# Patient Record
Sex: Female | Born: 1985 | Smoking: Never smoker
Health system: Southern US, Community
[De-identification: ages and names within clinical notes are randomized; demographics above are authoritative.]

## PROBLEM LIST (undated history)

## (undated) ENCOUNTER — Inpatient Hospital Stay (HOSPITAL_COMMUNITY): Payer: Self-pay

## (undated) DIAGNOSIS — I341 Nonrheumatic mitral (valve) prolapse: Secondary | ICD-10-CM

## (undated) DIAGNOSIS — D573 Sickle-cell trait: Secondary | ICD-10-CM

## (undated) DIAGNOSIS — N809 Endometriosis, unspecified: Secondary | ICD-10-CM

## (undated) DIAGNOSIS — Z8742 Personal history of other diseases of the female genital tract: Secondary | ICD-10-CM

## (undated) DIAGNOSIS — N83201 Unspecified ovarian cyst, right side: Secondary | ICD-10-CM

## (undated) DIAGNOSIS — R011 Cardiac murmur, unspecified: Secondary | ICD-10-CM

## (undated) DIAGNOSIS — K219 Gastro-esophageal reflux disease without esophagitis: Secondary | ICD-10-CM

## (undated) DIAGNOSIS — D219 Benign neoplasm of connective and other soft tissue, unspecified: Secondary | ICD-10-CM

## (undated) DIAGNOSIS — N83202 Unspecified ovarian cyst, left side: Secondary | ICD-10-CM

## (undated) DIAGNOSIS — D259 Leiomyoma of uterus, unspecified: Secondary | ICD-10-CM

## (undated) DIAGNOSIS — Z862 Personal history of diseases of the blood and blood-forming organs and certain disorders involving the immune mechanism: Secondary | ICD-10-CM

## (undated) DIAGNOSIS — N979 Female infertility, unspecified: Secondary | ICD-10-CM

## (undated) DIAGNOSIS — D649 Anemia, unspecified: Secondary | ICD-10-CM

## (undated) HISTORY — PX: OVARIAN CYST REMOVAL: SHX89

---

## 2009-07-15 HISTORY — PX: WISDOM TOOTH EXTRACTION: SHX21

## 2013-04-16 ENCOUNTER — Other Ambulatory Visit (HOSPITAL_COMMUNITY)
Admission: RE | Admit: 2013-04-16 | Discharge: 2013-04-16 | Disposition: A | Discharge status: Home / Self Care | Payer: No Typology Code available for payment source | Source: Ambulatory Visit | Attending: Obstetrics and Gynecology | Admitting: Obstetrics and Gynecology

## 2013-04-16 DIAGNOSIS — Z1151 Encounter for screening for human papillomavirus (HPV): Secondary | ICD-10-CM | POA: Insufficient documentation

## 2013-04-16 DIAGNOSIS — Z01419 Encounter for gynecological examination (general) (routine) without abnormal findings: Secondary | ICD-10-CM | POA: Insufficient documentation

## 2013-04-16 DIAGNOSIS — Z113 Encounter for screening for infections with a predominantly sexual mode of transmission: Secondary | ICD-10-CM | POA: Insufficient documentation

## 2013-04-16 DIAGNOSIS — R87810 Cervical high risk human papillomavirus (HPV) DNA test positive: Secondary | ICD-10-CM | POA: Insufficient documentation

## 2013-05-28 ENCOUNTER — Other Ambulatory Visit: Payer: Self-pay | Admitting: Obstetrics and Gynecology

## 2013-05-28 DIAGNOSIS — R1900 Intra-abdominal and pelvic swelling, mass and lump, unspecified site: Secondary | ICD-10-CM

## 2013-06-01 ENCOUNTER — Ambulatory Visit
Admission: RE | Admit: 2013-06-01 | Discharge: 2013-06-01 | Disposition: A | Discharge status: Home / Self Care | Payer: No Typology Code available for payment source | Source: Ambulatory Visit | Attending: Obstetrics and Gynecology | Admitting: Obstetrics and Gynecology

## 2013-06-01 DIAGNOSIS — R1900 Intra-abdominal and pelvic swelling, mass and lump, unspecified site: Secondary | ICD-10-CM

## 2013-06-01 MED ORDER — IOHEXOL 300 MG/ML  SOLN
100.0000 mL | Freq: Once | INTRAMUSCULAR | Status: AC | PRN
  Administered 2013-06-01: 100 mL via INTRAVENOUS

## 2013-07-02 HISTORY — PX: LAPAROSCOPIC ABDOMINAL EXPLORATION: SHX6249

## 2014-04-29 ENCOUNTER — Emergency Department (HOSPITAL_COMMUNITY): Payer: Managed Care, Other (non HMO)

## 2014-04-29 ENCOUNTER — Encounter (HOSPITAL_COMMUNITY): Payer: Self-pay | Admitting: Emergency Medicine

## 2014-04-29 ENCOUNTER — Emergency Department (HOSPITAL_COMMUNITY)
Admission: EM | Admit: 2014-04-29 | Discharge: 2014-04-29 | Disposition: A | Discharge status: Home / Self Care | Payer: Managed Care, Other (non HMO) | Attending: Emergency Medicine | Admitting: Emergency Medicine

## 2014-04-29 DIAGNOSIS — R0789 Other chest pain: Secondary | ICD-10-CM | POA: Diagnosis not present

## 2014-04-29 DIAGNOSIS — J069 Acute upper respiratory infection, unspecified: Secondary | ICD-10-CM | POA: Insufficient documentation

## 2014-04-29 DIAGNOSIS — Z8679 Personal history of other diseases of the circulatory system: Secondary | ICD-10-CM | POA: Diagnosis not present

## 2014-04-29 DIAGNOSIS — R05 Cough: Secondary | ICD-10-CM | POA: Diagnosis present

## 2014-04-29 HISTORY — DX: Nonrheumatic mitral (valve) prolapse: I34.1

## 2014-04-29 MED ORDER — ALBUTEROL SULFATE HFA 108 (90 BASE) MCG/ACT IN AERS: 2.0000 | INHALATION_SPRAY | RESPIRATORY_TRACT | Status: DC | PRN

## 2014-04-29 NOTE — ED Notes (Signed)
Per diagnosed with URI two days ago. Pt reports worsening SOB, left side chest pain, and nonproductive cough since.

## 2014-04-29 NOTE — ED Provider Notes (Signed)
TIME SEEN: 11:10 AM  CHIEF COMPLAINT: Dry cough, chest tightness  HPI: Patient is a 28 year old female with history of mitral valve prolapse who presents to the emergency department with dry cough, chest tightness and shortness of breath is worse with coughing that started 2 days ago. She was diagnosed with upper respiratory infection and started on azithromycin. She states that her symptoms have not been improving. She denies any fevers, chills. No nausea, vomiting or diarrhea. No history of hypertension, diabetes, hyperlipidemia, tobacco use or family history of premature CAD. No history of PE or DVT, recent prolonged immobilization such as long flight or hospitalization, fracture, surgery, trauma, exogenous hormone use.  No calf swelling or tenderness on exam.  ROS: See HPI Constitutional: no fever  Eyes: no drainage  ENT: no runny nose   Cardiovascular:   chest pain  Resp:  SOB  GI: no vomiting GU: no dysuria Integumentary: no rash  Allergy: no hives  Musculoskeletal: no leg swelling  Neurological: no slurred speech ROS otherwise negative  PAST MEDICAL HISTORY/PAST SURGICAL HISTORY:  Past Medical History  Diagnosis Date  . Mitral valve prolapse     MEDICATIONS:  Prior to Admission medications   Not on File    ALLERGIES:  No Known Allergies  SOCIAL HISTORY:  History  Substance Use Topics  . Smoking status: Never Smoker   . Smokeless tobacco: Not on file  . Alcohol Use: No    FAMILY HISTORY: No family history on file.  EXAM: BP 113/74  Pulse 81  Temp(Src) 98.2 F (36.8 C) (Oral)  Resp 14  SpO2 100%  LMP 04/26/2014 CONSTITUTIONAL: Alert and oriented and responds appropriately to questions. Well-appearing; well-nourished, pleasant, smiling, nontoxic HEAD: Normocephalic EYES: Conjunctivae clear, PERRL ENT: normal nose; no rhinorrhea; moist mucous membranes; pharynx without lesions noted NECK: Supple, no meningismus, no LAD  CARD: RRR; S1 and S2 appreciated; no  murmurs, no clicks, no rubs, no gallops RESP: Normal chest excursion without splinting or tachypnea; breath sounds clear and equal bilaterally; no wheezes, no rhonchi, no rales, no hypoxia or respiratory distress, speaking full sentences, no increased work of breathing ABD/GI: Normal bowel sounds; non-distended; soft, non-tender, no rebound, no guarding BACK:  The back appears normal and is non-tender to palpation, there is no CVA tenderness EXT: Normal ROM in all joints; non-tender to palpation; no edema; normal capillary refill; no cyanosis    SKIN: Normal color for age and race; warm NEURO: Moves all extremities equally PSYCH: The patient's mood and manner are appropriate. Grooming and personal hygiene are appropriate.  MEDICAL DECISION MAKING: Patient here with complaints of symptoms of a viral illness. Her lungs are clear to auscultation and she has no diminished breath sounds. No fevers. Discussed with patient the reason she is likely not getting better with azithromycin as of this is probably due to a virus. She is PERC negative and has no risk factors for ACS. Doubt dissection given she is very well-appearing and denies any radiation of her pain. Her EKG shows no ischemic changes. We'll obtain chest x-ray to evaluate for infiltrate, pneumothorax.  ED PROGRESS: Patient's chest x-ray is clear. We'll discharge home. We'll have her alternate Tylenol and Motrin for pain. She states that she was concerned about going back to work. Have discussed with patient I feel is appropriate that she may go back to work today. Discussed return precautions.      EKG Interpretation  Date/Time:  Friday April 29 2014 10:56:15 EDT Ventricular Rate:  89 PR Interval:  139 QRS Duration: 99 QT Interval:  364 QTC Calculation: 443 R Axis:   -35 Text Interpretation:  Sinus rhythm S1,S2,S3 pattern RSR' in V1 or V2, probably normal variant Baseline wander in lead(s) V4 V5 V6 Confirmed by WARD,  DO, KRISTEN  ([id number]) on 04/29/2014 10:58:19 AM        Levy, DO 04/29/14 1212

## 2014-04-29 NOTE — Discharge Instructions (Signed)
Upper Respiratory Infection, Adult An upper respiratory infection (URI) is also sometimes known as the common cold. The upper respiratory tract includes the nose, sinuses, throat, trachea, and bronchi. Bronchi are the airways leading to the lungs. Most people improve within 1 week, but symptoms can last up to 2 weeks. A residual cough may last even longer.  CAUSES Many different viruses can infect the tissues lining the upper respiratory tract. The tissues become irritated and inflamed and often become very moist. Mucus production is also common. A cold is contagious. You can easily spread the virus to others by oral contact. This includes kissing, sharing a glass, coughing, or sneezing. Touching your mouth or nose and then touching a surface, which is then touched by another person, can also spread the virus. SYMPTOMS  Symptoms typically develop 1 to 3 days after you come in contact with a cold virus. Symptoms vary from person to person. They may include:  Runny nose.  Sneezing.  Nasal congestion.  Sinus irritation.  Sore throat.  Loss of voice (laryngitis).  Cough.  Fatigue.  Muscle aches.  Loss of appetite.  Headache.  Low-grade fever. DIAGNOSIS  You might diagnose your own cold based on familiar symptoms, since most people get a cold 2 to 3 times a year. Your caregiver can confirm this based on your exam. Most importantly, your caregiver can check that your symptoms are not due to another disease such as strep throat, sinusitis, pneumonia, asthma, or epiglottitis. Blood tests, throat tests, and X-rays are not necessary to diagnose a common cold, but they may sometimes be helpful in excluding other more serious diseases. Your caregiver will decide if any further tests are required. RISKS AND COMPLICATIONS  You may be at risk for a more severe case of the common cold if you smoke cigarettes, have chronic heart disease (such as heart failure) or lung disease (such as asthma), or if  you have a weakened immune system. The very young and very old are also at risk for more serious infections. Bacterial sinusitis, middle ear infections, and bacterial pneumonia can complicate the common cold. The common cold can worsen asthma and chronic obstructive pulmonary disease (COPD). Sometimes, these complications can require emergency medical care and may be life-threatening. PREVENTION  The best way to protect against getting a cold is to practice good hygiene. Avoid oral or hand contact with people with cold symptoms. Wash your hands often if contact occurs. There is no clear evidence that vitamin C, vitamin E, echinacea, or exercise reduces the chance of developing a cold. However, it is always recommended to get plenty of rest and practice good nutrition. TREATMENT  Treatment is directed at relieving symptoms. There is no cure. Antibiotics are not effective, because the infection is caused by a virus, not by bacteria. Treatment may include:  Increased fluid intake. Sports drinks offer valuable electrolytes, sugars, and fluids.  Breathing heated mist or steam (vaporizer or shower).  Eating chicken soup or other clear broths, and maintaining good nutrition.  Getting plenty of rest.  Using gargles or lozenges for comfort.  Controlling fevers with ibuprofen or acetaminophen as directed by your caregiver.  Increasing usage of your inhaler if you have asthma. Zinc gel and zinc lozenges, taken in the first 24 hours of the common cold, can shorten the duration and lessen the severity of symptoms. Pain medicines may help with fever, muscle aches, and throat pain. A variety of non-prescription medicines are available to treat congestion and runny nose. Your caregiver   can make recommendations and may suggest nasal or lung inhalers for other symptoms.  HOME CARE INSTRUCTIONS   Only take over-the-counter or prescription medicines for pain, discomfort, or fever as directed by your  caregiver.  Use a warm mist humidifier or inhale steam from a shower to increase air moisture. This may keep secretions moist and make it easier to breathe.  Drink enough water and fluids to keep your urine clear or pale yellow.  Rest as needed.  Return to work when your temperature has returned to normal or as your caregiver advises. You may need to stay home longer to avoid infecting others. You can also use a face mask and careful hand washing to prevent spread of the virus. SEEK MEDICAL CARE IF:   After the first few days, you feel you are getting worse rather than better.  You need your caregiver's advice about medicines to control symptoms.  You develop chills, worsening shortness of breath, or brown or red sputum. These may be signs of pneumonia.  You develop yellow or brown nasal discharge or pain in the face, especially when you bend forward. These may be signs of sinusitis.  You develop a fever, swollen neck glands, pain with swallowing, or white areas in the back of your throat. These may be signs of strep throat. SEEK IMMEDIATE MEDICAL CARE IF:   You have a fever.  You develop severe or persistent headache, ear pain, sinus pain, or chest pain.  You develop wheezing, a prolonged cough, cough up blood, or have a change in your usual mucus (if you have chronic lung disease).  You develop sore muscles or a stiff neck. Document Released: 12/25/2000 Document Revised: 09/23/2011 Document Reviewed: 10/06/2013 ExitCare Patient Information 2015 [employer]. This information is not intended to replace advice given to you by your health care provider. Make sure you discuss any questions you have with your health care provider.  

## 2017-07-03 LAB — OB RESULTS CONSOLE ABO/RH: RH Type: POSITIVE

## 2017-07-15 NOTE — L&D Delivery Note (Signed)
Delivery Note Called by [facility] on the third floor women's unit at 0428 reporting that patient had 7 painful contractions in the last hour.  Upon my arrival, bedside u/s was performed and baby confirmed vtx.  VE performed and pt found to be FD and +1 station. Pt immediately transferred to L&D and NICU team notified for delivery.  Mg bolus started upon arrival.  At 6:13 AM a viable female was delivered via Vaginal, Spontaneous (Presentation:cephalic).  APGAR: 9, 10; weight 3 lb 14.1 oz (1760 g).   Placenta status:spont.  Cord: 3V with the following complications: none.  Cord pH: n/a  Speculum exam performed and posterior cervical lac noted that was not actively bleeding.  Cerclage identified, grasped and cut out without difficulty.  Remaining stitch appeared small but whole.  Anesthesia:  none Episiotomy: None Lacerations: 1st degree;Vaginal;Two rt Labial and two left labial lacs. Suture Repair: 2.0 3.0 vicryl Est. Blood Loss (mL): 100  Mom to postpartum.  Baby to NICU.  Delice Lesch 11/18/2017, 7:00 AM

## 2017-08-29 ENCOUNTER — Ambulatory Visit (HOSPITAL_COMMUNITY)
Admission: RE | Admit: 2017-08-29 | Discharge: 2017-08-29 | Disposition: A | Discharge status: Home / Self Care | Payer: PRIVATE HEALTH INSURANCE | Source: Ambulatory Visit | Attending: Obstetrics & Gynecology | Admitting: Obstetrics & Gynecology

## 2017-08-29 ENCOUNTER — Encounter (HOSPITAL_COMMUNITY)
Admission: AD | Disposition: A | Discharge status: Home / Self Care | Payer: Self-pay | Source: Ambulatory Visit | Attending: Obstetrics and Gynecology

## 2017-08-29 ENCOUNTER — Ambulatory Visit (HOSPITAL_COMMUNITY): Payer: PRIVATE HEALTH INSURANCE | Admitting: Anesthesiology

## 2017-08-29 ENCOUNTER — Other Ambulatory Visit: Payer: Self-pay | Admitting: Obstetrics and Gynecology

## 2017-08-29 ENCOUNTER — Other Ambulatory Visit: Payer: Self-pay

## 2017-08-29 ENCOUNTER — Other Ambulatory Visit (HOSPITAL_COMMUNITY): Payer: Self-pay | Admitting: Obstetrics & Gynecology

## 2017-08-29 ENCOUNTER — Ambulatory Visit (HOSPITAL_COMMUNITY)
Admission: AD | Admit: 2017-08-29 | Discharge: 2017-08-29 | Disposition: A | Discharge status: Home / Self Care | Payer: PRIVATE HEALTH INSURANCE | Source: Ambulatory Visit | Attending: Obstetrics and Gynecology | Admitting: Obstetrics and Gynecology

## 2017-08-29 ENCOUNTER — Encounter (HOSPITAL_COMMUNITY): Payer: Self-pay

## 2017-08-29 ENCOUNTER — Ambulatory Visit: Admit: 2017-08-29 | Payer: No Typology Code available for payment source | Admitting: Obstetrics and Gynecology

## 2017-08-29 DIAGNOSIS — Z3A20 20 weeks gestation of pregnancy: Secondary | ICD-10-CM

## 2017-08-29 DIAGNOSIS — O26872 Cervical shortening, second trimester: Secondary | ICD-10-CM | POA: Diagnosis not present

## 2017-08-29 DIAGNOSIS — K219 Gastro-esophageal reflux disease without esophagitis: Secondary | ICD-10-CM | POA: Insufficient documentation

## 2017-08-29 DIAGNOSIS — O283 Abnormal ultrasonic finding on antenatal screening of mother: Secondary | ICD-10-CM

## 2017-08-29 DIAGNOSIS — O3432 Maternal care for cervical incompetence, second trimester: Secondary | ICD-10-CM | POA: Insufficient documentation

## 2017-08-29 DIAGNOSIS — O99612 Diseases of the digestive system complicating pregnancy, second trimester: Secondary | ICD-10-CM | POA: Insufficient documentation

## 2017-08-29 DIAGNOSIS — O99412 Diseases of the circulatory system complicating pregnancy, second trimester: Secondary | ICD-10-CM | POA: Insufficient documentation

## 2017-08-29 DIAGNOSIS — I341 Nonrheumatic mitral (valve) prolapse: Secondary | ICD-10-CM | POA: Insufficient documentation

## 2017-08-29 DIAGNOSIS — O26879 Cervical shortening, unspecified trimester: Secondary | ICD-10-CM | POA: Insufficient documentation

## 2017-08-29 DIAGNOSIS — D573 Sickle-cell trait: Secondary | ICD-10-CM | POA: Diagnosis not present

## 2017-08-29 DIAGNOSIS — O99112 Other diseases of the blood and blood-forming organs and certain disorders involving the immune mechanism complicating pregnancy, second trimester: Secondary | ICD-10-CM | POA: Insufficient documentation

## 2017-08-29 HISTORY — DX: Anemia, unspecified: D64.9

## 2017-08-29 HISTORY — PX: CERVICAL CERCLAGE: SHX1329

## 2017-08-29 HISTORY — DX: Sickle-cell trait: D57.3

## 2017-08-29 HISTORY — DX: Gastro-esophageal reflux disease without esophagitis: K21.9

## 2017-08-29 LAB — CBC
HCT: 35.2 % — ABNORMAL LOW (ref 36.0–46.0)
Hemoglobin: 12.3 g/dL (ref 12.0–15.0)
MCH: 29.2 pg (ref 26.0–34.0)
MCHC: 34.9 g/dL (ref 30.0–36.0)
MCV: 83.6 fL (ref 78.0–100.0)
Platelets: 322 10*3/uL (ref 150–400)
RBC: 4.21 MIL/uL (ref 3.87–5.11)
RDW: 14.7 % (ref 11.5–15.5)
WBC: 10.8 10*3/uL — ABNORMAL HIGH (ref 4.0–10.5)

## 2017-08-29 LAB — WET PREP, GENITAL
SPERM: NONE SEEN
TRICH WET PREP: NONE SEEN
YEAST WET PREP: NONE SEEN

## 2017-08-29 LAB — ABO/RH: ABO/RH(D): O NEG

## 2017-08-29 LAB — TYPE AND SCREEN
ABO/RH(D): O NEG
Antibody Screen: NEGATIVE

## 2017-08-29 SURGERY — CERCLAGE, CERVIX, VAGINAL APPROACH
Anesthesia: Spinal | Site: Vagina | Laterality: Bilateral

## 2017-08-29 MED ORDER — SODIUM CHLORIDE 0.9 % IJ SOLN
Freq: Once | INTRAMUSCULAR | Status: DC
  Filled 2017-08-29: qty 1

## 2017-08-29 MED ORDER — FENTANYL CITRATE (PF) 100 MCG/2ML IJ SOLN
INTRAMUSCULAR | Status: AC
  Filled 2017-08-29: qty 2

## 2017-08-29 MED ORDER — MEPERIDINE HCL 25 MG/ML IJ SOLN: 6.2500 mg | INTRAMUSCULAR | Status: DC | PRN

## 2017-08-29 MED ORDER — ONDANSETRON HCL 4 MG/2ML IJ SOLN
INTRAMUSCULAR | Status: AC
  Filled 2017-08-29: qty 2

## 2017-08-29 MED ORDER — LACTATED RINGERS IV SOLN
INTRAVENOUS | Status: DC
  Administered 2017-08-29: 14:00:00 via INTRAVENOUS
  Administered 2017-08-29: 125 mL/h via INTRAVENOUS

## 2017-08-29 MED ORDER — FENTANYL CITRATE (PF) 100 MCG/2ML IJ SOLN
25.0000 ug | INTRAMUSCULAR | Status: DC | PRN
  Administered 2017-08-29: 25 ug via INTRAVENOUS

## 2017-08-29 MED ORDER — METOCLOPRAMIDE HCL 5 MG/ML IJ SOLN: 10.0000 mg | Freq: Once | INTRAMUSCULAR | Status: DC | PRN

## 2017-08-29 MED ORDER — SOD CITRATE-CITRIC ACID 500-334 MG/5ML PO SOLN: 30.0000 mL | Freq: Once | ORAL | Status: DC

## 2017-08-29 MED ORDER — FENTANYL CITRATE (PF) 100 MCG/2ML IJ SOLN
INTRAMUSCULAR | Status: DC | PRN
  Administered 2017-08-29 (×4): 50 ug via INTRAVENOUS

## 2017-08-29 MED ORDER — BUPIVACAINE IN DEXTROSE 0.75-8.25 % IT SOLN
INTRATHECAL | Status: DC | PRN
  Administered 2017-08-29: 1.2 mL via INTRATHECAL

## 2017-08-29 MED ORDER — HYDROCODONE-ACETAMINOPHEN 7.5-325 MG PO TABS: 1.0000 | ORAL_TABLET | Freq: Once | ORAL | Status: DC | PRN

## 2017-08-29 MED ORDER — PROGESTERONE MICRONIZED 200 MG PO CAPS: 200.0000 mg | ORAL_CAPSULE | Freq: Every day | ORAL | 2 refills | Status: DC

## 2017-08-29 MED ORDER — LACTATED RINGERS IV SOLN: INTRAVENOUS | Status: DC

## 2017-08-29 MED ORDER — ONDANSETRON HCL 4 MG/2ML IJ SOLN
INTRAMUSCULAR | Status: DC | PRN
  Administered 2017-08-29: 4 mg via INTRAVENOUS

## 2017-08-29 SURGICAL SUPPLY — 19 items
CANISTER SUCT 3000ML PPV (MISCELLANEOUS) ×3 IMPLANT
GLOVE BIO SURGEON STRL SZ7.5 (GLOVE) ×3 IMPLANT
GLOVE BIOGEL PI IND STRL 7.0 (GLOVE) ×1 IMPLANT
GLOVE BIOGEL PI IND STRL 7.5 (GLOVE) ×1 IMPLANT
GLOVE BIOGEL PI INDICATOR 7.0 (GLOVE) ×2
GLOVE BIOGEL PI INDICATOR 7.5 (GLOVE) ×2
GOWN STRL REUS W/TWL LRG LVL3 (GOWN DISPOSABLE) ×6 IMPLANT
NS IRRIG 1000ML POUR BTL (IV SOLUTION) ×3 IMPLANT
PACK VAGINAL MINOR WOMEN LF (CUSTOM PROCEDURE TRAY) ×3 IMPLANT
PAD OB MATERNITY 4.3X12.25 (PERSONAL CARE ITEMS) ×3 IMPLANT
PAD PREP 24X48 CUFFED NSTRL (MISCELLANEOUS) ×3 IMPLANT
SUT MERSILENE 5MM BP 1 12 (SUTURE) ×3 IMPLANT
SUT PROLENE 1 CT 1 30 (SUTURE) ×3 IMPLANT
SYR BULB IRRIGATION 50ML (SYRINGE) ×3 IMPLANT
TOWEL OR 17X24 6PK STRL BLUE (TOWEL DISPOSABLE) ×3 IMPLANT
TRAY FOLEY CATH SILVER 14FR (SET/KITS/TRAYS/PACK) ×3 IMPLANT
TUBING NON-CON 1/4 X 20 CONN (TUBING) ×2 IMPLANT
TUBING NON-CON 1/4 X 20' CONN (TUBING) ×1
YANKAUER SUCT BULB TIP NO VENT (SUCTIONS) ×3 IMPLANT

## 2017-08-29 NOTE — MAU Note (Signed)
Pt sent to MAU from MFM d/t short cervix.  Denies VB or LOF.

## 2017-08-29 NOTE — Op Note (Signed)
Preop Diagnosis: 1.20 2/7wks 2.Cervical insufficiency/Shortened Cervix  Postop Diagnosis: 1.20 2/7wks 2.Cervical insufficiency/Shortened Cervix  Procedure: Cervical Cerclage  Anesthesia: Spinal   Anesthesiologist: Josephine Igo, MD  Attending: Everett Graff, MD   Assistant: N/a  Findings: Short and friable cervix  Pathology: N/a  Fluids: 1200 cc  UOP: 100 cc  EBL: 50 cc  Complications: N/a  Procedure:Then patient was taken to the operating room after the risks, benefits and alternatives discussed with the patient and consent signed and witnessed.  The patient was given a spinal per anesthesia and placed in the dorsal lithotomy position.  The patient was prepped and draped in the usual sterile fashion.  A cervical cerclage stitch was placed using Mersilene and the knot was tied anteriorly on the cervix with a stitch of 1 prolene at the base to help elevate knot if necessary when it comes time for removal.  There was a fair amount of bleeding with placement of the first stitch but it was ultimately controlled with pressure and silver nitrate.  Clindamycin douche was performed.  Membranes remained intact and post procedure fetal heart rate was 140.  Sponge, lap and needle count was correct and the patient was transferred to the recovery room in good condition.

## 2017-08-29 NOTE — Transfer of Care (Signed)
Immediate Anesthesia Transfer of Care Note  Patient: Brandi Atkinson  Procedure(s) Performed: CERCLAGE CERVICAL (Bilateral Vagina )  Patient Location: PACU  Anesthesia Type:Spinal  Level of Consciousness: awake, alert  and oriented  Airway & Oxygen Therapy: Patient Spontanous Breathing  Post-op Assessment: Report given to RN and Post -op Vital signs reviewed and stable  Post vital signs: Reviewed and stable  Last Vitals:  Vitals:   08/29/17 1158 08/29/17 1433  BP: 104/66 102/72  Pulse: 97 82  Resp: 16 16  Temp: 36.8 C 36.6 C  SpO2: 99% 100%    Last Pain:  Vitals:   08/29/17 1158  TempSrc: Oral      Patients Stated Pain Goal: 5 (08/29/17 8590)BPJP Score 0  Complications: No apparent anesthesia complications

## 2017-08-29 NOTE — H&P (Signed)
Brandi Atkinson is an 32 y.o. female. Pt presents for cervical cerclage after being seen at [facility] this morning and short cervix confirmed which was initially dx'd on u/s in office yesterday.  Pt denies any bleeding or LOF.   Menstrual History: Patient's last menstrual period was 04/11/2017.    Past Medical History:  Diagnosis Date  . Anemia   . GERD (gastroesophageal reflux disease)   . Mitral valve prolapse   . Sickle cell trait [facility])     Past Surgical History:  Procedure Laterality Date  . OVARIAN CYST REMOVAL    . WISDOM TOOTH EXTRACTION  2011    History reviewed. No pertinent family history.  Social History:  reports that  has never smoked. she has never used smokeless tobacco. She reports that she does not drink alcohol or use drugs.  Allergies: No Known Allergies  Medications Prior to Admission  Medication Sig Dispense Refill Last Dose  . Doxylamine-Pyridoxine (DICLEGIS PO) Take by mouth.   08/26/2017 at Unknown time  . ketoconazole (NIZORAL) 2 % cream Apply to affected area daily as needed   Past Week at Unknown time  . Prenatal Vit-Fe Fumarate-FA (PRENATAL VITAMIN PO) Take 1 tablet by mouth daily.    08/28/2017 at Unknown time    ROS Non-contributory  Blood pressure 104/66, pulse 97, temperature 98.2 F (36.8 C), temperature source Oral, resp. rate 16, height 5\' 4"  (1.626 m), weight 126 lb 8 oz (57.4 kg), last menstrual period 04/11/2017, SpO2 99 %. Physical Exam  Lungs CTA CV RRR Abd gravid Ext no calf tenderness  Results for orders placed or performed during the hospital encounter of 08/29/17 (from the past 24 hour(s))  CBC     Status: Abnormal   Collection Time: 08/29/17 11:50 AM  Result Value Ref Range   WBC 10.8 (H) 4.0 - 10.5 K/uL   RBC 4.21 3.87 - 5.11 MIL/uL   Hemoglobin 12.3 12.0 - 15.0 g/dL   HCT 35.2 (L) 36.0 - 46.0 %   MCV 83.6 78.0 - 100.0 fL   MCH 29.2 26.0 - 34.0 pg   MCHC 34.9 30.0 - 36.0 g/dL   RDW 14.7 11.5 - 15.5 %   Platelets 322 150 -  400 K/uL    [place] Mfm Ob Transvaginal  Result Date: 08/29/2017 ----------------------------------------------------------------------  OBSTETRICS REPORT                      (Signed Final 08/29/2017 11:27 am) ---------------------------------------------------------------------- Patient Info  ID #:       [id number]                          D.O.B.:  September 11, 1985 (31 yrs)  Name:       Brandi Atkinson                 Visit Date: 08/29/2017 10:39 am ---------------------------------------------------------------------- Performed By  Performed By:     Hubert Azure          Ref. Address:     [address]                    [id number]                                                             #  [place]  Attending:        Griffin Dakin MD         Location:         [facility]  Referred By:      De Nurse ---------------------------------------------------------------------- Orders   #  Description                                 Code   1  [place] MFM OB TRANSVAGINAL                      [phone]  ----------------------------------------------------------------------   #  Ordered By               Order #        Accession #    Episode #   1  Sanjuana Kava               [id number]      [id number]     [id number]  ---------------------------------------------------------------------- Indications   [redacted] weeks gestation of pregnancy                Z3A.20   Cervical shortening, second trimester          O26.872  ---------------------------------------------------------------------- OB History  Gravidity:    1 ---------------------------------------------------------------------- Fetal Evaluation  Num Of Fetuses:     1  Fetal Heart         142  Rate(bpm):  Cardiac Activity:   Observed  Presentation:       Cephalic  Placenta:           Anterior, above cervical os  P. Cord Insertion:  Visualized, central   Amniotic Fluid  AFI FV:      Subjectively within normal limits                              Largest Pocket(cm)                              6.24  Comment:    Stomach and bladder noted ---------------------------------------------------------------------- Gestational Age  LMP:           20w 0d        Date:  04/11/17                 EDD:   01/16/18  Clinical EDD:  Hyacinth Meeker 2d  EDD:   01/14/18  Best:          Hyacinth Meeker 2d     Det. By:  Clinical EDD             EDD:   01/14/18 ---------------------------------------------------------------------- Cervix Uterus Adnexa  Cervix  Length:            1.7  cm.  Funneling of internal os noted. Dynamic (1.65-2.1cm)  Uterus  No abnormality visualized.  Left Ovary  Not visualized.  Right Ovary  Not visualized.  Adnexa:       No abnormality visualized. ---------------------------------------------------------------------- Impression  Intrauterine pregnancy at 20+2 weeks with suspected short  cervix here for transvaginal evaluation  Cephalic presentation  Normal fetal cardiac activity  Transvaginal cervical length is 17 mm wit funnelling. debis is  noted in the funnel ---------------------------------------------------------------------- Recommendations  See MFM consult ----------------------------------------------------------------------                 Griffin Dakin, MD Electronically Signed Final Report   08/29/2017 11:27 am ----------------------------------------------------------------------  FHR 157  Assessment/Plan: P0 at 20 2/7wks with shortened cervix presenting for rescue cerclage.  R/B/A discussed with pt including but not limited to B/I/I and possible loss of pregnancy.  Questions answered.  Consent s/w.    Delice Lesch 08/29/2017, 12:15 PM

## 2017-08-29 NOTE — Treatment Plan (Signed)
[facility] seen [facility] by Dr. Lucia Gaskins.  Pt ambulated with family member and signed into MAU for further evaluation per Dr. Benito Mccreedy.

## 2017-08-29 NOTE — OR Nursing (Signed)
Addendum to last note. Felicia Morris,RN ordered wet prep on pt. Since OR epic order sets do not allow me to order this test.

## 2017-08-29 NOTE — OR Nursing (Signed)
Brandi Atkinson in MAU ordered a vaginal wet prep on pt since OR epic order sets do not allow me to order this test.

## 2017-08-29 NOTE — Anesthesia Preprocedure Evaluation (Signed)
Anesthesia Evaluation  Patient identified by MRN, date of birth, ID band Patient awake    Reviewed: Allergy & Precautions, NPO status , Patient's Chart, lab work & pertinent test results  Airway Mallampati: I  TM Distance: >3 FB Neck ROM: Full    Dental no notable dental hx.    Pulmonary neg pulmonary ROS,    Pulmonary exam normal breath sounds clear to auscultation       Cardiovascular negative cardio ROS Normal cardiovascular exam+ Valvular Problems/Murmurs MVP  Rhythm:Regular Rate:Normal     Neuro/Psych negative neurological ROS  negative psych ROS   GI/Hepatic Neg liver ROS, GERD  Medicated and Controlled,  Endo/Other  negative endocrine ROS  Renal/GU negative Renal ROS  negative genitourinary   Musculoskeletal negative musculoskeletal ROS (+)   Abdominal   Peds  Hematology  (+) Sickle cell trait and anemia ,   Anesthesia Other Findings   Reproductive/Obstetrics (+) Pregnancy 20 2/7 weeks Short cervix                             Anesthesia Physical Anesthesia Plan  ASA: II  Anesthesia Plan: Spinal   Post-op Pain Management:    Induction:   PONV Risk Score and Plan: Ondansetron, Dexamethasone and Treatment may vary due to age or medical condition  Airway Management Planned: Natural Airway  Additional Equipment:   Intra-op Plan:   Post-operative Plan:   Informed Consent: I have reviewed the patients History and Physical, chart, labs and discussed the procedure including the risks, benefits and alternatives for the proposed anesthesia with the patient or authorized representative who has indicated his/her understanding and acceptance.   Dental advisory given  Plan Discussed with: CRNA, Anesthesiologist and Surgeon  Anesthesia Plan Comments:         Anesthesia Quick Evaluation

## 2017-08-29 NOTE — Anesthesia Procedure Notes (Signed)
Spinal  Patient location during procedure: OR Start time: 08/29/2017 1:30 PM Staffing Anesthesiologist: Josephine Igo, MD Performed: anesthesiologist  Preanesthetic Checklist Completed: patient identified, site marked, surgical consent, pre-op evaluation, timeout performed, IV checked, risks and benefits discussed and monitors and equipment checked Spinal Block Patient position: sitting Prep: site prepped and draped and DuraPrep Patient monitoring: heart rate, cardiac monitor, continuous pulse ox and blood pressure Approach: midline Location: L3-4 Injection technique: single-shot Needle Needle type: Pencan  Needle gauge: 24 G Needle length: 9 cm Needle insertion depth: 5 cm Assessment Sensory level: T8 Additional Notes Patient tolerated procedure well. Adequate sensory level.

## 2017-08-29 NOTE — Consult Note (Signed)
Maternal Fetal Medicine Consultation  Requesting Provider(s): [facility]  Primary OB: [facility] Reason for consultation: Suspected short cervix, considering cerclage  HPI:31 yo P0000 at 20+2 weeks with short cervix on office [place] yesterday; is considering cerclage. Vaginal progesterone was ordered yesterday but has not been started yet. [place] in our office today confirmed a short cervix of 48mm with funneling and some debris. The patient is asymptomatic (see ROS) This is an IVF pregnancy through [facility]  OB History: OB History    Gravida Para Term Preterm AB Living   1             SAB TAB Ectopic Multiple Live Births                  PMH:  Past Medical History:  Diagnosis Date  . Mitral valve prolapse     PSH:  Past Surgical History:  Procedure Laterality Date  . OVARIAN CYST REMOVAL     Meds: PNV, Diclegis, vaginal progesterone Allergies: NKDA FH: See EPIC section Soc: See EPIC section  Review of Systems: no vaginal bleeding or cramping/contractions, no leaking fluid, no changes in vaginal discharge or rhythmic back pain. All other systems reviewed and are negative.  PE:  VS: BP 114/68 pulse 79 weight 127 lb GEN: well-appearing female ABD: gravid, NT  Please see separate document for fetal ultrasound report.  A/P: Short cervical length without evidence of preterm uterine activity Given the current clinical situation and her presentation I believe she is a good candidate for ultrasound-indicated cerclage. We discussed the procedure at length. In her clinical situation the risk of adverse outcome from the procedure such as PPROM or infection is between 1 in 200 and 1 in 400. We discussed postoperative care. 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 I would like to recheck her cervix 2 weeks postprocedure and if stable she can restart her school activities. I would continue the vaginal progesterone as ordered even after the  cerclage  Thank you for the opportunity to be a part of the care of Andelyn M Chavis. Please contact our office if we can be of further assistance.   I spent approximately 30 minutes with this patient with over 50% of time spent in face-to-face counseling.

## 2017-08-29 NOTE — Anesthesia Postprocedure Evaluation (Signed)
Anesthesia Post Note  Patient: VERL WHITMORE  Procedure(s) Performed: CERCLAGE CERVICAL (Bilateral Vagina )     Patient location during evaluation: PACU Anesthesia Type: Spinal Level of consciousness: awake and alert and oriented Vital Signs Assessment: post-procedure vital signs reviewed and stable Respiratory status: spontaneous breathing and nonlabored ventilation Cardiovascular status: blood pressure returned to baseline and stable Postop Assessment: no headache, no backache, spinal receding, patient able to bend at knees and no apparent nausea or vomiting Anesthetic complications: no    Last Vitals:  Vitals:   08/29/17 1615 08/29/17 1630  BP: 108/74 112/77  Pulse: 87 93  Resp: 17 14  Temp:  36.8 C  SpO2: 99% 100%    Last Pain:  Vitals:   08/29/17 1615  TempSrc:   PainSc: 1    Pain Goal: Patients Stated Pain Goal: 5 (08/29/17 1615)               Addisyn Leclaire A.

## 2017-08-30 ENCOUNTER — Encounter (HOSPITAL_COMMUNITY): Payer: Self-pay | Admitting: Obstetrics and Gynecology

## 2017-09-01 ENCOUNTER — Encounter (HOSPITAL_COMMUNITY): Payer: Self-pay

## 2017-09-02 ENCOUNTER — Other Ambulatory Visit (HOSPITAL_COMMUNITY): Payer: Self-pay | Admitting: *Deleted

## 2017-09-02 DIAGNOSIS — Z9889 Other specified postprocedural states: Secondary | ICD-10-CM

## 2017-09-12 ENCOUNTER — Encounter (HOSPITAL_COMMUNITY): Payer: Self-pay

## 2017-09-12 ENCOUNTER — Other Ambulatory Visit (HOSPITAL_COMMUNITY): Payer: Self-pay | Admitting: Obstetrics and Gynecology

## 2017-09-12 ENCOUNTER — Other Ambulatory Visit (HOSPITAL_COMMUNITY): Payer: Self-pay | Admitting: *Deleted

## 2017-09-12 ENCOUNTER — Ambulatory Visit (HOSPITAL_COMMUNITY)
Admission: RE | Admit: 2017-09-12 | Discharge: 2017-09-12 | Disposition: A | Discharge status: Home / Self Care | Payer: PRIVATE HEALTH INSURANCE | Source: Ambulatory Visit | Attending: Obstetrics & Gynecology | Admitting: Obstetrics & Gynecology

## 2017-09-12 DIAGNOSIS — O3430 Maternal care for cervical incompetence, unspecified trimester: Secondary | ICD-10-CM

## 2017-09-12 DIAGNOSIS — O09812 Supervision of pregnancy resulting from assisted reproductive technology, second trimester: Secondary | ICD-10-CM | POA: Diagnosis not present

## 2017-09-12 DIAGNOSIS — O3432 Maternal care for cervical incompetence, second trimester: Secondary | ICD-10-CM | POA: Insufficient documentation

## 2017-09-12 DIAGNOSIS — Z3A22 22 weeks gestation of pregnancy: Secondary | ICD-10-CM | POA: Insufficient documentation

## 2017-09-12 DIAGNOSIS — Z9889 Other specified postprocedural states: Secondary | ICD-10-CM

## 2017-09-25 ENCOUNTER — Other Ambulatory Visit (HOSPITAL_COMMUNITY): Payer: PRIVATE HEALTH INSURANCE

## 2017-09-29 ENCOUNTER — Encounter (HOSPITAL_COMMUNITY): Payer: Self-pay

## 2017-09-29 ENCOUNTER — Other Ambulatory Visit (HOSPITAL_COMMUNITY): Payer: Self-pay | Admitting: Maternal and Fetal Medicine

## 2017-09-29 ENCOUNTER — Ambulatory Visit (HOSPITAL_COMMUNITY)
Admission: RE | Admit: 2017-09-29 | Discharge: 2017-09-29 | Disposition: A | Discharge status: Home / Self Care | Payer: PRIVATE HEALTH INSURANCE | Source: Ambulatory Visit | Attending: Obstetrics & Gynecology | Admitting: Obstetrics & Gynecology

## 2017-09-29 DIAGNOSIS — O3430 Maternal care for cervical incompetence, unspecified trimester: Secondary | ICD-10-CM

## 2017-09-29 DIAGNOSIS — O09812 Supervision of pregnancy resulting from assisted reproductive technology, second trimester: Secondary | ICD-10-CM

## 2017-09-29 DIAGNOSIS — O26872 Cervical shortening, second trimester: Secondary | ICD-10-CM | POA: Insufficient documentation

## 2017-09-29 DIAGNOSIS — O3432 Maternal care for cervical incompetence, second trimester: Secondary | ICD-10-CM | POA: Diagnosis present

## 2017-09-29 DIAGNOSIS — Z3A24 24 weeks gestation of pregnancy: Secondary | ICD-10-CM

## 2017-09-29 IMAGING — US US MFM OB TRANSVAGINAL
1 series · 14 of 28 positions shown · non-contrast
Comparison: none

#130

1  NAYHUA            [PHONE_NUMBER]      [PHONE_NUMBER]     [PHONE_NUMBER]
Indications
24 weeks gestation of pregnancy
Cervical cerclage suture present, second       [AK]
trimester; vaginal progesterone
Pregnancy resulting from assisted              [AK]
reproductive technology
Cervical incompetence, second trimester        [AK]
Cervical shortening, second trimester          [AK]
OB History
Gravidity:    1
Fetal Evaluation
Num Of Fetuses:     1
Fetal Heart         145
Rate(bpm):
Cardiac Activity:   Observed
Presentation:       Cephalic
Placenta:           Anterior, above cervical os
Amniotic Fluid
AFI FV:      Subjectively within normal limits
Largest Pocket(cm)
5.26
Gestational Age
LMP:           24w 3d        Date:  [DATE]                 EDD:   [DATE]
Clinical EDD:  24w 5d                                        EDD:   [DATE]
Best:          24w 5d     Det. By:  Clinical EDD             EDD:   [DATE]
Anatomy
Stomach:               Appears normal, left   Bladder:                Appears normal
sided
Kidneys:               Appear normal
Cervix Uterus Adnexa
Cervix
Length:            0.8  cm.
Measured transvaginally. Cerclage visualized. Funneling of internal
os noted.
Impression
INDICATION: 31 yr old G1P0 at [AK] with cervical shortening
s/p ultrasound indicated cerclage for cervical length.

[Series 1: us mfm ob transvaginal · 41 acquisitions, 14 frames shown]
[im 2/41]
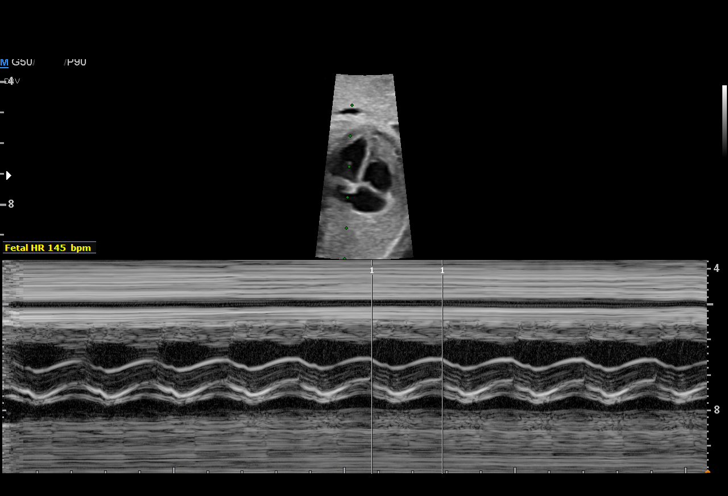
[im 5/41]
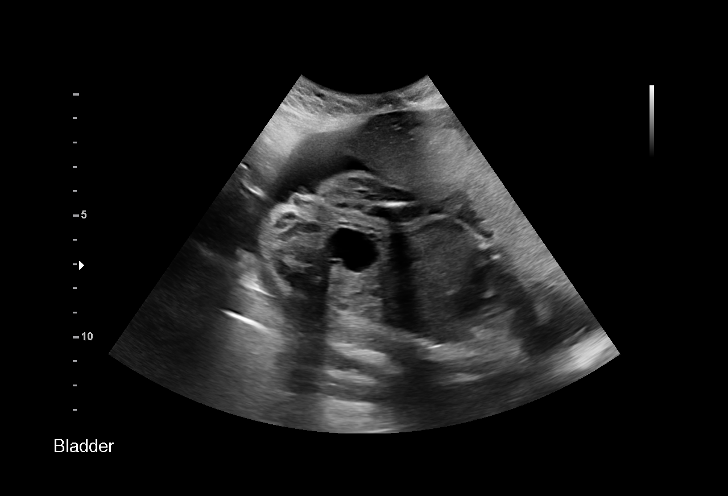
[im 8/41]
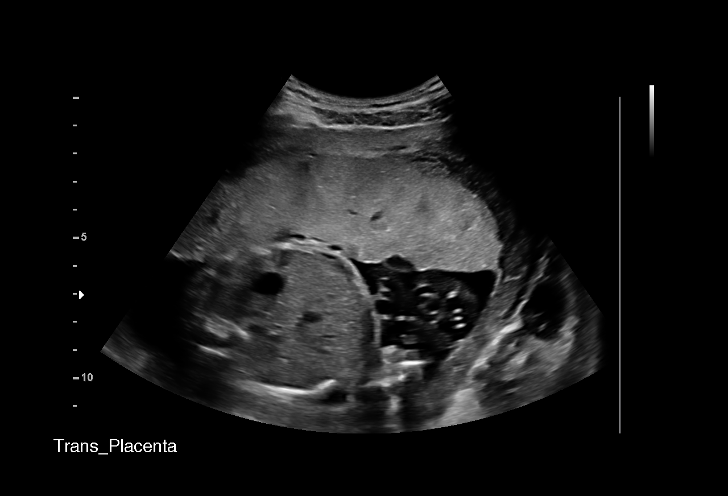
[im 11/41]
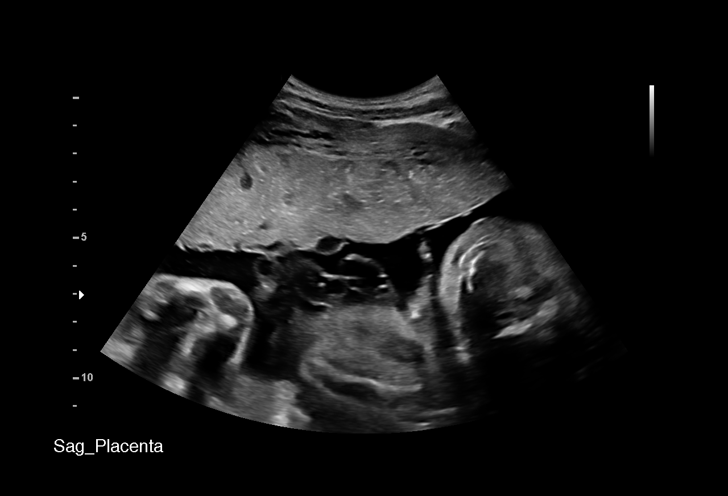
[im 14/41]
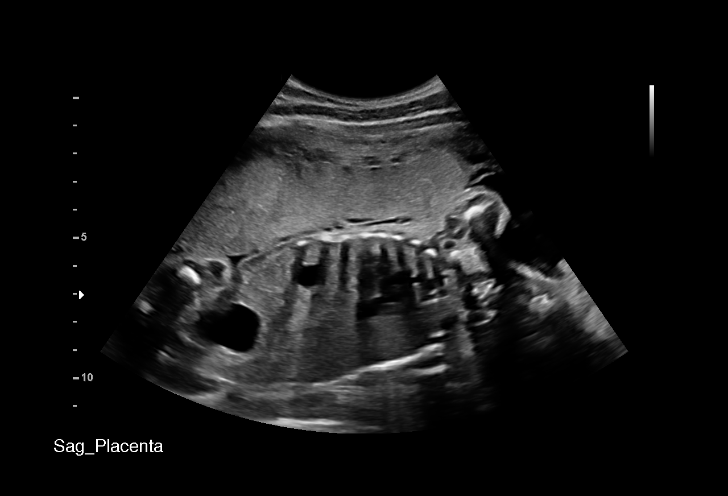
[im 17/41]
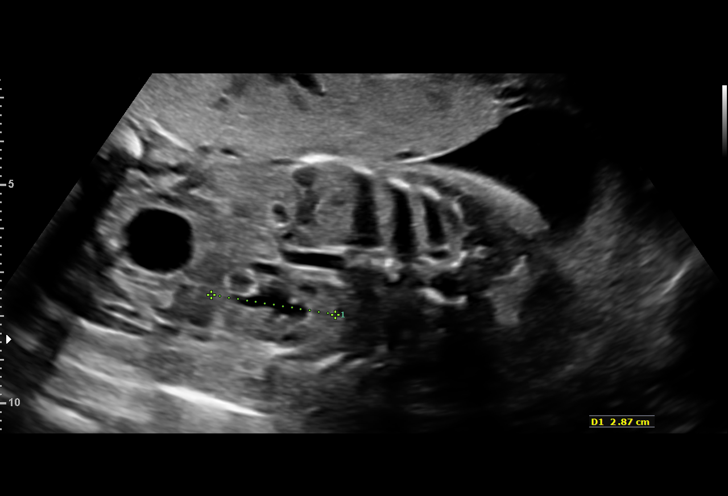
[im 20/41]
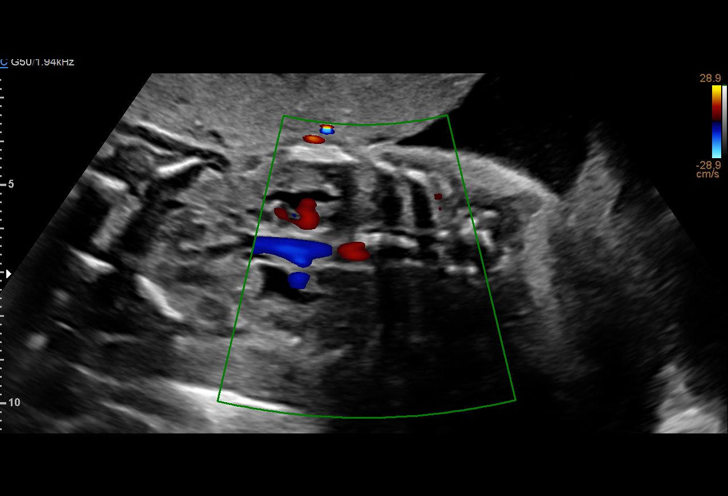
[im 23/41]
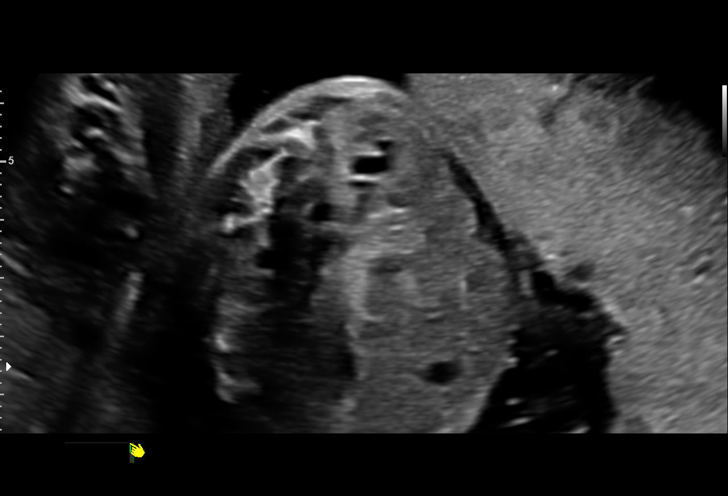
[im 26/41]
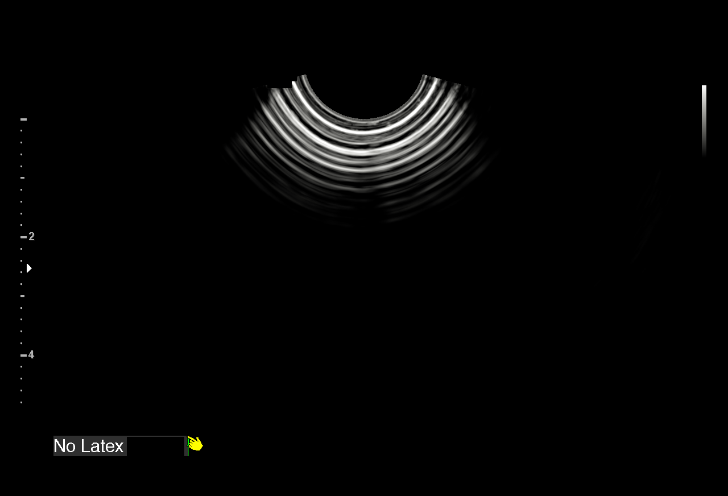
[im 29/41]
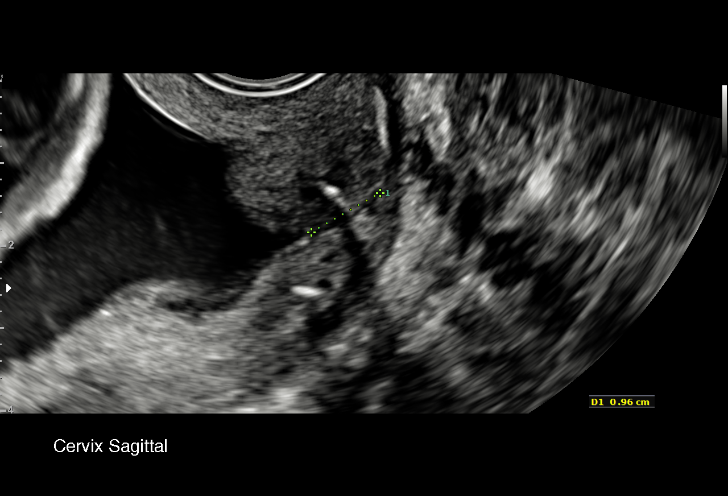
[im 32/41]
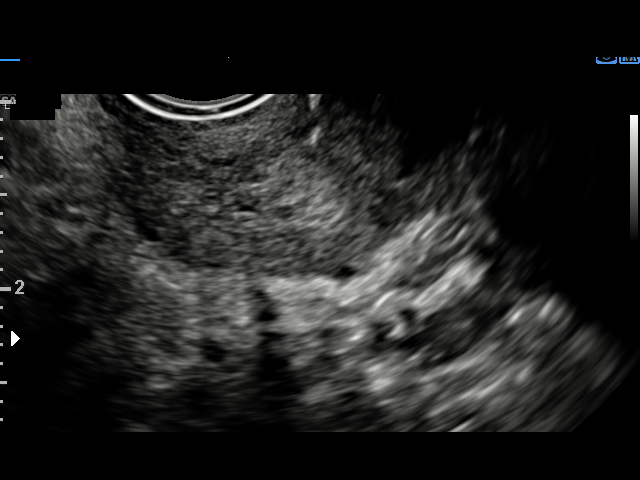
[im 35/41]
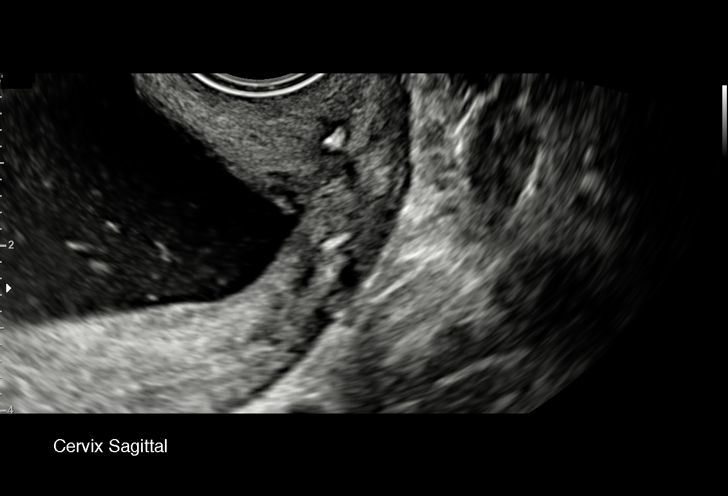
[im 38/41]
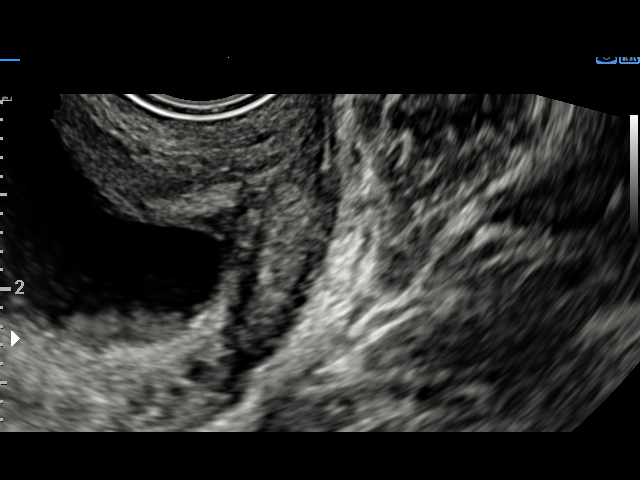
[im 41/41]
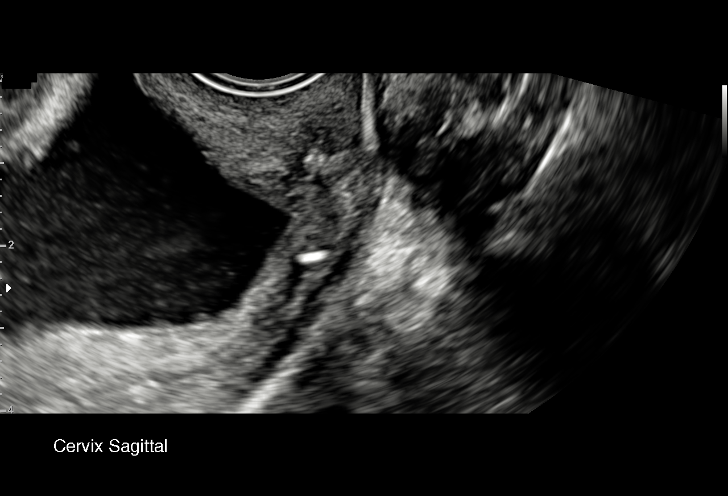

[14 of 28 positions shown; findings below may reference images not displayed]

FINDINGS: 1. Single intrauterine pregnancy with normal cardiac activity.
2. Anterior placenta without evidence of previa.
3. Normal amniotic fluid volume.
4. Shortened transvaginal cervical length to 0.8cm; there is
funneling to the level of the cerclage. The cerclage is
visualized.
5. The limited anatomy survey is normal.
Recommendations

1. Cervical shortening:
- previously counseled
- s/p ultrasound indicated cerclage; recommend remove at 37
weeks or sooner if clinically indicated
- on vaginal progesterone- recommend continue until 36
weeks
- recommend preterm labor precautions
- recommend pelvic rest and modified activity
- discussed NAYHUA and benefits in reducing
morbidity and mortality associated with premaurity
- after counseling patient wished to receive NAYHUA
today and tomorrow (1st dose given today)
- cervical length is stable from prior exam
- no further transvaginal cervical lengths indicated unless
symptoms develop
- recommend follow with clinical exams as indicated

## 2017-09-29 MED ORDER — BETAMETHASONE SOD PHOS & ACET 6 (3-3) MG/ML IJ SUSP
12.0000 mg | Freq: Once | INTRAMUSCULAR | Status: AC
  Administered 2017-09-29: 12 mg via INTRAMUSCULAR
  Filled 2017-09-29: qty 2

## 2017-09-30 ENCOUNTER — Ambulatory Visit (HOSPITAL_COMMUNITY)
Admission: RE | Admit: 2017-09-30 | Discharge: 2017-09-30 | Disposition: A | Discharge status: Home / Self Care | Payer: PRIVATE HEALTH INSURANCE | Source: Ambulatory Visit | Attending: Obstetrics & Gynecology | Admitting: Obstetrics & Gynecology

## 2017-09-30 DIAGNOSIS — Z3A00 Weeks of gestation of pregnancy not specified: Secondary | ICD-10-CM | POA: Diagnosis not present

## 2017-09-30 DIAGNOSIS — O3430 Maternal care for cervical incompetence, unspecified trimester: Secondary | ICD-10-CM | POA: Insufficient documentation

## 2017-09-30 MED ORDER — BETAMETHASONE SOD PHOS & ACET 6 (3-3) MG/ML IJ SUSP
12.0000 mg | Freq: Once | INTRAMUSCULAR | Status: AC
  Administered 2017-09-30: 12 mg via INTRAMUSCULAR
  Filled 2017-09-30: qty 2

## 2017-10-31 ENCOUNTER — Other Ambulatory Visit (HOSPITAL_COMMUNITY): Payer: Self-pay | Admitting: Obstetrics & Gynecology

## 2017-10-31 DIAGNOSIS — Z3A30 30 weeks gestation of pregnancy: Secondary | ICD-10-CM

## 2017-10-31 DIAGNOSIS — Z3689 Encounter for other specified antenatal screening: Secondary | ICD-10-CM

## 2017-11-04 ENCOUNTER — Encounter (HOSPITAL_COMMUNITY): Payer: Self-pay

## 2017-11-05 ENCOUNTER — Encounter (HOSPITAL_COMMUNITY): Payer: Self-pay

## 2017-11-05 ENCOUNTER — Other Ambulatory Visit (HOSPITAL_COMMUNITY): Payer: Self-pay | Admitting: *Deleted

## 2017-11-05 ENCOUNTER — Ambulatory Visit (HOSPITAL_COMMUNITY)
Admission: RE | Admit: 2017-11-05 | Discharge: 2017-11-05 | Disposition: A | Discharge status: Home / Self Care | Payer: PRIVATE HEALTH INSURANCE | Source: Ambulatory Visit | Attending: Obstetrics & Gynecology | Admitting: Obstetrics & Gynecology

## 2017-11-05 DIAGNOSIS — Z362 Encounter for other antenatal screening follow-up: Secondary | ICD-10-CM

## 2017-11-05 DIAGNOSIS — Z3A30 30 weeks gestation of pregnancy: Secondary | ICD-10-CM | POA: Diagnosis not present

## 2017-11-05 DIAGNOSIS — Z3403 Encounter for supervision of normal first pregnancy, third trimester: Secondary | ICD-10-CM | POA: Diagnosis not present

## 2017-11-05 DIAGNOSIS — Z3689 Encounter for other specified antenatal screening: Secondary | ICD-10-CM

## 2017-11-05 DIAGNOSIS — O365990 Maternal care for other known or suspected poor fetal growth, unspecified trimester, not applicable or unspecified: Secondary | ICD-10-CM | POA: Insufficient documentation

## 2017-11-05 HISTORY — DX: Benign neoplasm of connective and other soft tissue, unspecified: D21.9

## 2017-11-05 HISTORY — DX: Unspecified ovarian cyst, left side: N83.202

## 2017-11-05 HISTORY — DX: Unspecified ovarian cyst, right side: N83.201

## 2017-11-10 ENCOUNTER — Encounter (HOSPITAL_COMMUNITY): Payer: Self-pay

## 2017-11-10 ENCOUNTER — Other Ambulatory Visit: Payer: Self-pay

## 2017-11-10 ENCOUNTER — Inpatient Hospital Stay (HOSPITAL_COMMUNITY)
Admission: AD | Admit: 2017-11-10 | Discharge: 2017-11-10 | Disposition: A | Discharge status: Home / Self Care | Payer: PRIVATE HEALTH INSURANCE | Source: Ambulatory Visit | Attending: Obstetrics & Gynecology | Admitting: Obstetrics & Gynecology

## 2017-11-10 DIAGNOSIS — O26879 Cervical shortening, unspecified trimester: Secondary | ICD-10-CM

## 2017-11-10 DIAGNOSIS — N760 Acute vaginitis: Secondary | ICD-10-CM

## 2017-11-10 DIAGNOSIS — B9689 Other specified bacterial agents as the cause of diseases classified elsewhere: Secondary | ICD-10-CM

## 2017-11-10 DIAGNOSIS — O4693 Antepartum hemorrhage, unspecified, third trimester: Secondary | ICD-10-CM

## 2017-11-10 DIAGNOSIS — O26853 Spotting complicating pregnancy, third trimester: Secondary | ICD-10-CM | POA: Insufficient documentation

## 2017-11-10 DIAGNOSIS — Z3A30 30 weeks gestation of pregnancy: Secondary | ICD-10-CM | POA: Insufficient documentation

## 2017-11-10 LAB — URINALYSIS, ROUTINE W REFLEX MICROSCOPIC
BILIRUBIN URINE: NEGATIVE
GLUCOSE, UA: NEGATIVE mg/dL
Ketones, ur: NEGATIVE mg/dL
NITRITE: NEGATIVE
Protein, ur: NEGATIVE mg/dL
SPECIFIC GRAVITY, URINE: 1.01 (ref 1.005–1.030)
WBC, UA: >50 WBC/hpf — ABNORMAL HIGH (ref 0–5)
pH: 6 (ref 5.0–8.0)

## 2017-11-10 MED ORDER — NIFEDIPINE 10 MG PO CAPS
20.0000 mg | ORAL_CAPSULE | ORAL | Status: DC | PRN
  Administered 2017-11-10: 20 mg via ORAL
  Filled 2017-11-10: qty 2

## 2017-11-10 NOTE — MAU Note (Signed)
Went to dr's office because of spotting, placed on monitor- ctx's noted.  Sent here for further eval. Has a cerclage.

## 2017-11-10 NOTE — MAU Provider Note (Signed)
History    CSN: [id number]  Arrival date and time: 11/10/17 1233   None    Chief Complaint  Patient presents with  . Vaginal Bleeding  . Contractions   HPI  32 y/o G1P0 @ 30 weeks 5 days with history of short cervix and rescue cerclage placement at [redacted] weeks EGA here from office complaining of vaginal spotting for two days.  She reports she was on vaginal progesterone until last Wednesday when she stopped it and then noticed the bleeding recently.  She reports some right sided abdominal pain.  At the office a speculum exam was done and the light bleeding was seen to be coming from a cerclage stitch point but cerclage in place.  EDC 01/14/18 by embryo transfer date.    Past Medical History:  Diagnosis Date  . Anemia   . Bilateral ovarian cysts   . Fibroids   . GERD (gastroesophageal reflux disease)   . Mitral valve prolapse   . Sickle cell trait [facility])     Past Surgical History:  Procedure Laterality Date  . CERVICAL CERCLAGE Bilateral 08/29/2017   Procedure: CERCLAGE CERVICAL;  Surgeon: Everett Graff, MD;  Location: [facility];  Service: Gynecology;  Laterality: Bilateral;  . OVARIAN CYST REMOVAL    . WISDOM TOOTH EXTRACTION  2011    History reviewed. No pertinent family history.  Social History   Tobacco Use  . Smoking status: Never Smoker  . Smokeless tobacco: Never Used  Substance Use Topics  . Alcohol use: No  . Drug use: No    Allergies: No Known Allergies  Medications Prior to Admission  Medication Sig Dispense Refill Last Dose  . Doxylamine-Pyridoxine (DICLEGIS PO) Take by mouth.   11/10/2017 at Unknown time  . ketoconazole (NIZORAL) 2 % cream Apply to affected area daily as needed   Past Month at Unknown time  . Prenatal Vit-Fe Fumarate-FA (PRENATAL VITAMIN PO) Take 1 tablet by mouth daily.    11/09/2017 at Unknown time  . progesterone (PROMETRIUM) 200 MG capsule Place 1 capsule (200 mg total) vaginally at bedtime. 90 capsule 2 11/09/2017 at Unknown time     Review of Systems   Constitutional: Denies fevers/chills Cardiovascular: Denies chest pain or palpitations Pulmonary: Denies coughing or wheezing Gastrointestinal: Denies nausea, vomiting or diarrhea Genitourinary: Denies pelvic pain, unusual vaginal discharge, dysuria, urgency or frequency.  With unusual vaginal bleeding Musculoskeletal: Denies muscle or joint aches and pain.  Neurology: Denies abnormal sensations such as tingling or numbness.   Physical Exam   Blood pressure 117/70, pulse 99, temperature 98.4 F (36.9 C), temperature source Oral, resp. rate 16, weight 64.5 kg (142 lb 4 oz), last menstrual period 04/11/2017, SpO2 100 %.  Physical Exam Gen: NAD Abdomen: Soft, gravid, non tender Cervix: closed/70%/-1/  Cerclage in place.  Small yellow-brown smear on glove.  Extremities: warm and well perfused.  NST: 130 BL, mod variability, reactive. TOCO: Irregular low amplitude contractions Q 10 - 15 minutes.   MAU Course Patient received IV hydration and procardia PO for tocolysis and her low amplitude contractions stopped.  She did not have any more bleeding noted since her MAU presentation, her peripad was dry.  She reported feeling better and was deemed stable for discharge.    Assessment and Plan  32 y/o G1P0 @ 30 weeks 5 days EGA with third trimester vaginal bleeding, likely from the cerclage stitch, no current active bleeding, contractions have stopped with IV hydration and tocolysis  -Discharge to home with preterm labor and  bleeding precautions.  -Continue with modified bed rest and pelvic rest  -BVaginosis treatment as per office exam -Keep her upcoming office appointment next week as scheduled.  -Pt. Reports she received betamethasone for fetal lung maturity at [redacted] weeks EGA.  Alinda Dooms, MD.  11/10/2017, 4:25 PM

## 2017-11-10 NOTE — MAU Note (Signed)
Pt is  G1P0 at 30.5 weeks.  Noticed blood in toilet this am and went to [facility] office for assessment.  CTX noted on NST in office.  Pt currently has cerclage in place.  SVE noted no tension on stitch.  Pt sent to MAU for further evaluation.  No LOF, positive FM.

## 2017-11-10 NOTE — Discharge Instructions (Signed)

## 2017-11-17 ENCOUNTER — Encounter (HOSPITAL_COMMUNITY): Payer: Self-pay | Admitting: *Deleted

## 2017-11-17 ENCOUNTER — Inpatient Hospital Stay (HOSPITAL_COMMUNITY): Payer: PRIVATE HEALTH INSURANCE

## 2017-11-17 ENCOUNTER — Inpatient Hospital Stay (HOSPITAL_COMMUNITY)
Admission: AD | Admit: 2017-11-17 | Discharge: 2017-11-20 | DRG: 768 | Disposition: A | Discharge status: Home / Self Care | Payer: PRIVATE HEALTH INSURANCE | Source: Ambulatory Visit | Attending: Obstetrics and Gynecology | Admitting: Obstetrics and Gynecology

## 2017-11-17 DIAGNOSIS — Z3A31 31 weeks gestation of pregnancy: Secondary | ICD-10-CM

## 2017-11-17 DIAGNOSIS — O9962 Diseases of the digestive system complicating childbirth: Secondary | ICD-10-CM | POA: Diagnosis present

## 2017-11-17 DIAGNOSIS — O42913 Preterm premature rupture of membranes, unspecified as to length of time between rupture and onset of labor, third trimester: Secondary | ICD-10-CM | POA: Diagnosis present

## 2017-11-17 DIAGNOSIS — D573 Sickle-cell trait: Secondary | ICD-10-CM | POA: Diagnosis present

## 2017-11-17 DIAGNOSIS — O9902 Anemia complicating childbirth: Secondary | ICD-10-CM | POA: Diagnosis present

## 2017-11-17 DIAGNOSIS — O42919 Preterm premature rupture of membranes, unspecified as to length of time between rupture and onset of labor, unspecified trimester: Secondary | ICD-10-CM | POA: Diagnosis present

## 2017-11-17 DIAGNOSIS — O26873 Cervical shortening, third trimester: Secondary | ICD-10-CM | POA: Diagnosis present

## 2017-11-17 LAB — URINALYSIS, ROUTINE W REFLEX MICROSCOPIC
BILIRUBIN URINE: NEGATIVE
Glucose, UA: NEGATIVE mg/dL
HGB URINE DIPSTICK: NEGATIVE
KETONES UR: NEGATIVE mg/dL
NITRITE: NEGATIVE
Protein, ur: NEGATIVE mg/dL
Specific Gravity, Urine: 1.006 (ref 1.005–1.030)
pH: 6 (ref 5.0–8.0)

## 2017-11-17 LAB — POCT FERN TEST: POCT FERN TEST: POSITIVE

## 2017-11-17 LAB — CBC
HCT: 32 % — ABNORMAL LOW (ref 36.0–46.0)
Hemoglobin: 11.2 g/dL — ABNORMAL LOW (ref 12.0–15.0)
MCH: 29.7 pg (ref 26.0–34.0)
MCHC: 35 g/dL (ref 30.0–36.0)
MCV: 84.9 fL (ref 78.0–100.0)
PLATELETS: 342 10*3/uL (ref 150–400)
RBC: 3.77 MIL/uL — ABNORMAL LOW (ref 3.87–5.11)
RDW: 13.7 % (ref 11.5–15.5)
WBC: 12.4 10*3/uL — AB (ref 4.0–10.5)

## 2017-11-17 MED ORDER — AZITHROMYCIN 250 MG PO TABS: 500.0000 mg | ORAL_TABLET | Freq: Once | ORAL | Status: DC

## 2017-11-17 MED ORDER — PRENATAL MULTIVITAMIN CH: 1.0000 | ORAL_TABLET | Freq: Every day | ORAL | Status: DC

## 2017-11-17 MED ORDER — CALCIUM CARBONATE ANTACID 500 MG PO CHEW: 2.0000 | CHEWABLE_TABLET | ORAL | Status: DC | PRN

## 2017-11-17 MED ORDER — SODIUM CHLORIDE 0.9 % IV SOLN
2.0000 g | Freq: Four times a day (QID) | INTRAVENOUS | Status: DC
  Administered 2017-11-18: 2 g via INTRAVENOUS
  Filled 2017-11-17: qty 2
  Filled 2017-11-17 (×2): qty 2000

## 2017-11-17 MED ORDER — AMOXICILLIN 500 MG PO CAPS: 500.0000 mg | ORAL_CAPSULE | Freq: Three times a day (TID) | ORAL | Status: DC

## 2017-11-17 MED ORDER — DOCUSATE SODIUM 100 MG PO CAPS: 100.0000 mg | ORAL_CAPSULE | Freq: Every day | ORAL | Status: DC

## 2017-11-17 MED ORDER — AZITHROMYCIN 250 MG PO TABS
1000.0000 mg | ORAL_TABLET | Freq: Once | ORAL | Status: AC
  Administered 2017-11-18: 1000 mg via ORAL
  Filled 2017-11-17: qty 4

## 2017-11-17 MED ORDER — ACETAMINOPHEN 325 MG PO TABS: 650.0000 mg | ORAL_TABLET | ORAL | Status: DC | PRN

## 2017-11-17 MED ORDER — ZOLPIDEM TARTRATE 5 MG PO TABS: 5.0000 mg | ORAL_TABLET | Freq: Every evening | ORAL | Status: DC | PRN

## 2017-11-17 NOTE — MAU Provider Note (Addendum)
History     CSN: [id number]  Arrival date and time: 11/17/17 0539   First Provider Initiated Contact with Patient 11/17/17 2049      Chief Complaint  Patient presents with  . Rupture of Membranes   HPI Brandi Atkinson is a 32 y.o. G1P0 at [redacted]w[redacted]d who presents with leaking of fluid. Pregnancy complicated by cervical shortening and she has a cerclage in place. Reports leaking clear fluid since about 7 pm tonight. Denies abdominal pain or vaginal bleeding. No dysuria or recent intercourse. Taking oral progesterone. Positive fetal movement.   OB History    Gravida  1   Para      Term      Preterm      AB      Living        SAB      TAB      Ectopic      Multiple      Live Births              Past Medical History:  Diagnosis Date  . Anemia   . Bilateral ovarian cysts   . Fibroids   . GERD (gastroesophageal reflux disease)   . Mitral valve prolapse   . Sickle cell trait [facility])     Past Surgical History:  Procedure Laterality Date  . CERVICAL CERCLAGE Bilateral 08/29/2017   Procedure: CERCLAGE CERVICAL;  Surgeon: Everett Graff, MD;  Location: [facility];  Service: Gynecology;  Laterality: Bilateral;  . OVARIAN CYST REMOVAL    . WISDOM TOOTH EXTRACTION  2011    History reviewed. No pertinent family history.  Social History   Tobacco Use  . Smoking status: Never Smoker  . Smokeless tobacco: Never Used  Substance Use Topics  . Alcohol use: No  . Drug use: No    Allergies: No Known Allergies  Medications Prior to Admission  Medication Sig Dispense Refill Last Dose  . Doxylamine-Pyridoxine (DICLEGIS PO) Take by mouth.   11/10/2017 at Unknown time  . ketoconazole (NIZORAL) 2 % cream Apply to affected area daily as needed   Past Month at Unknown time  . Prenatal Vit-Fe Fumarate-FA (PRENATAL VITAMIN PO) Take 1 tablet by mouth daily.    11/09/2017 at Unknown time  . progesterone (PROMETRIUM) 200 MG capsule Place 1 capsule (200 mg total) vaginally at bedtime.  90 capsule 2 11/09/2017 at Unknown time    Review of Systems  Constitutional: Negative.   Gastrointestinal: Negative.   Genitourinary: Positive for vaginal discharge. Negative for dysuria and vaginal bleeding.   Physical Exam   Blood pressure 124/71, pulse 99, temperature 98.5 F (36.9 C), temperature source Oral, resp. rate 17, last menstrual period 04/11/2017.  Physical Exam  Nursing note and vitals reviewed. Constitutional: She is oriented to person, place, and time. She appears well-developed and well-nourished. No distress.  HENT:  Head: Normocephalic and atraumatic.  Eyes: Conjunctivae are normal. Right eye exhibits no discharge. Left eye exhibits no discharge. No scleral icterus.  Neck: Normal range of motion.  Respiratory: Effort normal. No respiratory distress.  GI: Soft. There is no tenderness.  Genitourinary: Cervix exhibits friability.  Genitourinary Comments: + pooling of clear fluid. Cervix friable.  Cervix visually closed.   Neurological: She is alert and oriented to person, place, and time.  Skin: Skin is warm and dry. She is not diaphoretic.  Psychiatric: She has a normal mood and affect. Her behavior is normal. Judgment and thought content normal.    MAU Course  Procedures Results for orders placed or performed during the hospital encounter of 11/17/17 (from the past 24 hour(s))  POCT fern test     Status: Abnormal   Collection Time: 11/17/17  9:14 PM  Result Value Ref Range   POCT Fern Test Positive = ruptured amniotic membanes     MDM NST:  Baseline: 135 bpm, Variability: Good {> 6 bpm), Accelerations: Reactive, Decelerations: Absent and no ctx Category 1 tracing. Pt denies abdominal pain & no ctx noted on monitor.  Attempted to collect amnisure; blood on swab -- lab rejected. SSE performed, cerix visually closed but friable. Pooling of clear fluid.  Fern positive  Dr. Mancel Bale notified of exam. Will admit patient. RN to collect GBS swab.   Assessment  and Plan  A: 1. Preterm premature rupture of membranes (PPROM) with unknown onset of labor   2. [redacted] weeks gestation of pregnancy    P: Dr. Mancel Bale to admit patient d/t PPROM  Brandi Atkinson 11/17/2017, 8:49 PM   Brandi Atkinson is being admitted d/t PPROM (+Fern per Brandi Atkinson, APP in MAU and amnisure unable to be done d/t small amount of blood in vault).  She presented on 11/10/17 with some bleeding from area at site of stitch and found to be contracting.  IV hydration and tocolysis with procardia were given, bleeding and contractions stopped.  Per Dr. Fonnie Jarvis office note, the pt stopped her vaginal progesterone around 30wks.  I am currently not sure why but will ask pt.  She denies any abdominal pain and there are no contractions on the monitor per Erin's report.  The patient had a rescue cerclage placed on 08/29/17 at 20 1/7wks and rec'd BMZ course on 09/29/17 and 09/30/17.  Pt will be started on antibiotics, one time dose of azithromycin, ampicillin for 48hrs and then amoxicillin to complete 7 day course.  I asked Erin to collect GBS prior to pt leaving MAU.  A UA and CBC will be done as well.  MFM consulted for further recs and complete OB u/s (another dose of BMZ?).

## 2017-11-17 NOTE — Progress Notes (Signed)
Amniosure specimen collected and sent to lab. Lab unable to run specimen due to blood in tube.  Erin L., np notified.

## 2017-11-17 NOTE — H&P (Addendum)
Chief Complaint  Patient presents with  . Rupture of Membranes   HPI Brandi Atkinson is a 32 y.o. G1P0 at [redacted]w[redacted]d who presents with leaking of fluid. Pregnancy complicated by cervical shortening and she has a cerclage in place. Reports leaking clear fluid since about 7 pm tonight. Denies abdominal pain or vaginal bleeding. No dysuria or recent intercourse. Taking oral progesterone. Positive fetal movement.           OB History    Gravida  1   Para      Term      Preterm      AB      Living        SAB      TAB      Ectopic      Multiple      Live Births                  Past Medical History:  Diagnosis Date  . Anemia   . Bilateral ovarian cysts   . Fibroids   . GERD (gastroesophageal reflux disease)   . Mitral valve prolapse   . Sickle cell trait [facility])          Past Surgical History:  Procedure Laterality Date  . CERVICAL CERCLAGE Bilateral 08/29/2017   Procedure: CERCLAGE CERVICAL;  Surgeon: Everett Graff, MD;  Location: [facility];  Service: Gynecology;  Laterality: Bilateral;  . OVARIAN CYST REMOVAL    . WISDOM TOOTH EXTRACTION  2011    History reviewed. No pertinent family history.  Social History       Tobacco Use  . Smoking status: Never Smoker  . Smokeless tobacco: Never Used  Substance Use Topics  . Alcohol use: No  . Drug use: No    Allergies: No Known Allergies         Medications Prior to Admission  Medication Sig Dispense Refill Last Dose  . Doxylamine-Pyridoxine (DICLEGIS PO) Take by mouth.   11/10/2017 at Unknown time  . ketoconazole (NIZORAL) 2 % cream Apply to affected area daily as needed   Past Month at Unknown time  . Prenatal Vit-Fe Fumarate-FA (PRENATAL VITAMIN PO) Take 1 tablet by mouth daily.    11/09/2017 at Unknown time  . progesterone (PROMETRIUM) 200 MG capsule Place 1 capsule (200 mg total) vaginally at bedtime. 90 capsule 2 11/09/2017 at Unknown time    Review of Systems   Constitutional: Negative.   Gastrointestinal: Negative.   Genitourinary: Positive for vaginal discharge. Negative for dysuria and vaginal bleeding.   Physical Exam   Blood pressure 124/71, pulse 99, temperature 98.5 F (36.9 C), temperature source Oral, resp. rate 17, last menstrual period 04/11/2017.  Physical Exam  Nursing note and vitals reviewed. Constitutional: She is oriented to person, place, and time. She appears well-developed and well-nourished. No distress.  HENT:  Head: Normocephalic and atraumatic.  Eyes: Conjunctivae are normal. Right eye exhibits no discharge. Left eye exhibits no discharge. No scleral icterus.  Neck: Normal range of motion.  Respiratory: Effort normal. No respiratory distress.  GI: Soft. There is no tenderness.  Genitourinary: Cervix exhibits friability.  Genitourinary Comments: + pooling of clear fluid. Cervix friable.  Cervix visually closed.   Neurological: She is alert and oriented to person, place, and time.  Skin: Skin is warm and dry. She is not diaphoretic.  Psychiatric: She has a normal mood and affect. Her behavior is normal. Judgment and thought content normal.    MAU Course  Procedures  LabResultsLast24Hours       Results for orders placed or performed during the hospital encounter of 11/17/17 (from the past 24 hour(s))  POCT fern test     Status: Abnormal   Collection Time: 11/17/17  9:14 PM  Result Value Ref Range   POCT Fern Test Positive = ruptured amniotic membanes       MDM NST:  Baseline: 135 bpm, Variability: Good {> 6 bpm), Accelerations: Reactive, Decelerations: Absent and no ctx Category 1 tracing. Pt denies abdominal pain & no ctx noted on monitor.  Attempted to collect amnisure; blood on swab -- lab rejected. SSE performed, cerix visually closed but friable. Pooling of clear fluid.  Fern positive  Dr. Mancel Bale notified of exam. Will admit patient. RN to collect GBS swab.   Assessment and Plan   A: 1. Preterm premature rupture of membranes (PPROM) with unknown onset of labor   2. [redacted] weeks gestation of pregnancy    P: Dr. Mancel Bale to admit patient d/t PPROM  Jorje Guild 11/17/2017, 8:49 PM   [name] is being admitted d/t PPROM (+Fern per Jorje Guild, APP in MAU and amnisure unable to be done d/t small amount of blood in vault).  She presented on 11/10/17 with some bleeding from area at site of stitch and found to be contracting.  IV hydration and tocolysis with procardia were given, bleeding and contractions stopped.  Per Dr. Fonnie Jarvis office note, the pt requested to be switched from vaginal progesterone to oral progesterone around 30wks per Dr. Tyler Aas office note. She denies any abdominal pain and there are no contractions on the monitor per Erin's report.  The patient had a rescue cerclage placed on 08/29/17 at 20 1/7wks and rec'd BMZ course on 09/29/17 and 09/30/17.  Pt will be started on antibiotics, one time dose of azithromycin, ampicillin for 48hrs and then amoxicillin to complete 7 day course.  I asked Erin to collect GBS prior to pt leaving MAU.  A UA, Cx and CBC will be done as well.  MFM consulted for further recs and complete OB u/s (another dose of BMZ?).  FHT baseline 135, cat 1 and reactive. No visible contractions.  PRENATAL TRANSFER TOOL   Maternal Diabetes: No Genetic Screening: Normal Maternal Ultrasounds/Referrals: Normal (spine not and post fossa not visualized but AFP neg) Fetal Ultrasounds or other Referrals:  None Maternal Substance Abuse:  No Significant Maternal Medications:  Meds include: Progesterone Significant Maternal Lab Results:  Lab values include: Other: GBS pending Other Comments:  S/p rescue cerclage at 20 1/7 wks on 08/29/17 and s/p BMZ on 09/29/17 and 09/30/17

## 2017-11-17 NOTE — MAU Note (Signed)
Pt here with c/o leaking fluid. Reports a gush of fluid about 1930. Has had a cerclage in place since 20 weeks. Was here two weeks ago with contractions.

## 2017-11-18 ENCOUNTER — Other Ambulatory Visit: Payer: Self-pay

## 2017-11-18 ENCOUNTER — Encounter (HOSPITAL_COMMUNITY): Payer: Self-pay

## 2017-11-18 DIAGNOSIS — Z3A31 31 weeks gestation of pregnancy: Secondary | ICD-10-CM

## 2017-11-18 MED ORDER — ZOLPIDEM TARTRATE 5 MG PO TABS: 5.0000 mg | ORAL_TABLET | Freq: Every evening | ORAL | Status: DC | PRN

## 2017-11-18 MED ORDER — SENNOSIDES-DOCUSATE SODIUM 8.6-50 MG PO TABS
2.0000 | ORAL_TABLET | ORAL | Status: DC
  Administered 2017-11-18: 2 via ORAL
  Filled 2017-11-18 (×2): qty 2

## 2017-11-18 MED ORDER — LIDOCAINE HCL (PF) 1 % IJ SOLN
INTRAMUSCULAR | Status: AC
  Administered 2017-11-18: 30 mL
  Filled 2017-11-18: qty 30

## 2017-11-18 MED ORDER — WITCH HAZEL-GLYCERIN EX PADS: 1.0000 "application " | MEDICATED_PAD | CUTANEOUS | Status: DC | PRN

## 2017-11-18 MED ORDER — OXYCODONE HCL 5 MG PO TABS: 10.0000 mg | ORAL_TABLET | ORAL | Status: DC | PRN

## 2017-11-18 MED ORDER — OXYTOCIN 40 UNITS IN LACTATED RINGERS INFUSION - SIMPLE MED: 2.5000 [IU]/h | INTRAVENOUS | Status: DC

## 2017-11-18 MED ORDER — PRENATAL MULTIVITAMIN CH
1.0000 | ORAL_TABLET | Freq: Every day | ORAL | Status: DC
  Administered 2017-11-18 – 2017-11-20 (×3): 1 via ORAL
  Filled 2017-11-18 (×3): qty 1

## 2017-11-18 MED ORDER — LIDOCAINE HCL (PF) 1 % IJ SOLN
30.0000 mL | INTRAMUSCULAR | Status: DC | PRN
  Filled 2017-11-18: qty 30

## 2017-11-18 MED ORDER — OXYCODONE HCL 5 MG PO TABS: 5.0000 mg | ORAL_TABLET | ORAL | Status: DC | PRN

## 2017-11-18 MED ORDER — MAGNESIUM SULFATE 40 G IN LACTATED RINGERS - SIMPLE
2.0000 g/h | INTRAVENOUS | Status: DC
  Administered 2017-11-18: 2 g/h via INTRAVENOUS
  Filled 2017-11-18: qty 40

## 2017-11-18 MED ORDER — BENZOCAINE-MENTHOL 20-0.5 % EX AERO
1.0000 "application " | INHALATION_SPRAY | CUTANEOUS | Status: DC | PRN
  Administered 2017-11-18: 1 via TOPICAL
  Filled 2017-11-18: qty 56

## 2017-11-18 MED ORDER — BUTORPHANOL TARTRATE 1 MG/ML IJ SOLN
2.0000 mg | Freq: Once | INTRAMUSCULAR | Status: AC
  Administered 2017-11-18: 2 mg via INTRAVENOUS

## 2017-11-18 MED ORDER — OXYTOCIN BOLUS FROM INFUSION: 500.0000 mL | Freq: Once | INTRAVENOUS | Status: DC

## 2017-11-18 MED ORDER — BUTORPHANOL TARTRATE 1 MG/ML IJ SOLN
INTRAMUSCULAR | Status: AC
  Filled 2017-11-18: qty 2

## 2017-11-18 MED ORDER — OXYCODONE-ACETAMINOPHEN 5-325 MG PO TABS: 1.0000 | ORAL_TABLET | ORAL | Status: DC | PRN

## 2017-11-18 MED ORDER — SOD CITRATE-CITRIC ACID 500-334 MG/5ML PO SOLN: 30.0000 mL | ORAL | Status: DC | PRN

## 2017-11-18 MED ORDER — MAGNESIUM SULFATE BOLUS VIA INFUSION
4.0000 g | Freq: Once | INTRAVENOUS | Status: AC
  Administered 2017-11-18: 4 g via INTRAVENOUS
  Filled 2017-11-18: qty 500

## 2017-11-18 MED ORDER — OXYTOCIN 40 UNITS IN LACTATED RINGERS INFUSION - SIMPLE MED
INTRAVENOUS | Status: AC
  Filled 2017-11-18: qty 1000

## 2017-11-18 MED ORDER — SIMETHICONE 80 MG PO CHEW: 80.0000 mg | CHEWABLE_TABLET | ORAL | Status: DC | PRN

## 2017-11-18 MED ORDER — OXYCODONE-ACETAMINOPHEN 5-325 MG PO TABS: 2.0000 | ORAL_TABLET | ORAL | Status: DC | PRN

## 2017-11-18 MED ORDER — ONDANSETRON HCL 4 MG/2ML IJ SOLN: 4.0000 mg | INTRAMUSCULAR | Status: DC | PRN

## 2017-11-18 MED ORDER — TERBUTALINE SULFATE 1 MG/ML IJ SOLN: 0.2500 mg | Freq: Once | INTRAMUSCULAR | Status: DC | PRN

## 2017-11-18 MED ORDER — LACTATED RINGERS IV SOLN: INTRAVENOUS | Status: DC

## 2017-11-18 MED ORDER — ACETAMINOPHEN 325 MG PO TABS: 650.0000 mg | ORAL_TABLET | ORAL | Status: DC | PRN

## 2017-11-18 MED ORDER — ONDANSETRON HCL 4 MG/2ML IJ SOLN: 4.0000 mg | Freq: Four times a day (QID) | INTRAMUSCULAR | Status: DC | PRN

## 2017-11-18 MED ORDER — IBUPROFEN 600 MG PO TABS
600.0000 mg | ORAL_TABLET | Freq: Four times a day (QID) | ORAL | Status: DC
  Administered 2017-11-18 – 2017-11-20 (×4): 600 mg via ORAL
  Filled 2017-11-18 (×8): qty 1

## 2017-11-18 MED ORDER — COCONUT OIL OIL
1.0000 "application " | TOPICAL_OIL | Status: DC | PRN
  Administered 2017-11-18: 1 via TOPICAL
  Filled 2017-11-18: qty 120

## 2017-11-18 MED ORDER — ONDANSETRON HCL 4 MG PO TABS: 4.0000 mg | ORAL_TABLET | ORAL | Status: DC | PRN

## 2017-11-18 MED ORDER — TETANUS-DIPHTH-ACELL PERTUSSIS 5-2.5-18.5 LF-MCG/0.5 IM SUSP: 0.5000 mL | Freq: Once | INTRAMUSCULAR | Status: DC

## 2017-11-18 MED ORDER — DIPHENHYDRAMINE HCL 25 MG PO CAPS: 25.0000 mg | ORAL_CAPSULE | Freq: Four times a day (QID) | ORAL | Status: DC | PRN

## 2017-11-18 MED ORDER — DIBUCAINE 1 % RE OINT: 1.0000 "application " | TOPICAL_OINTMENT | RECTAL | Status: DC | PRN

## 2017-11-18 MED ORDER — OXYTOCIN 40 UNITS IN LACTATED RINGERS INFUSION - SIMPLE MED
1.0000 m[IU]/min | INTRAVENOUS | Status: DC
  Administered 2017-11-18: 2 m[IU]/min via INTRAVENOUS

## 2017-11-18 MED ORDER — LACTATED RINGERS IV SOLN: 500.0000 mL | INTRAVENOUS | Status: DC | PRN

## 2017-11-18 MED ORDER — NIFEDIPINE 10 MG PO CAPS
10.0000 mg | ORAL_CAPSULE | ORAL | Status: DC | PRN
  Administered 2017-11-18: 10 mg via ORAL
  Filled 2017-11-18: qty 1

## 2017-11-18 NOTE — Lactation Note (Signed)
This note was copied from a baby's chart. Lactation Consultation Note  Patient Name: Girl Keatyn Luck Today's Date: 11/18/2017   P1, Baby 33 hours old and in NICU.  [redacted]w[redacted]d. Reviewed hand expression and helped mother w/ DEBP. No drops expressed.  Encouraged mother to hand express before and after pumping q 2.5-3 hours. Mother's nipples are sensitive and she stated she felt discomfort with pumping. Turned suction down and lubricated both flanges with coconut oil. Mother stated she could tolerate pumping one breast at a time. Recommend mother post pump 8 times per day for 10-20 min with DEBP on initiation setting. Reviewed cleaning and milk storage.  Mother has breastmilk labels and NICU booklet. Mom made aware of O/P services, breastfeeding support groups, community resources, and our phone # for post-discharge questions.        Maternal Data    Feeding Feeding Type: Donor Breast Milk  LATCH Score                   Interventions    Lactation Tools Discussed/Used     Consult Status      Carlye Grippe 11/18/2017, 2:27 PM

## 2017-11-19 LAB — CULTURE, OB URINE

## 2017-11-19 LAB — CBC
HCT: 29.3 % — ABNORMAL LOW (ref 36.0–46.0)
HEMOGLOBIN: 10.2 g/dL — AB (ref 12.0–15.0)
MCH: 30.1 pg (ref 26.0–34.0)
MCHC: 34.8 g/dL (ref 30.0–36.0)
MCV: 86.4 fL (ref 78.0–100.0)
PLATELETS: 316 10*3/uL (ref 150–400)
RBC: 3.39 MIL/uL — AB (ref 3.87–5.11)
RDW: 13.4 % (ref 11.5–15.5)
WBC: 16.9 10*3/uL — ABNORMAL HIGH (ref 4.0–10.5)

## 2017-11-19 LAB — TYPE AND SCREEN
ABO/RH(D): O NEG
ANTIBODY SCREEN: NEGATIVE
Weak D: POSITIVE

## 2017-11-19 LAB — KLEIHAUER-BETKE STAIN
# VIALS RHIG: 1
Fetal Cells %: 0 %
QUANTITATION FETAL HEMOGLOBIN: 0 mL

## 2017-11-19 LAB — RPR: RPR Ser Ql: NONREACTIVE

## 2017-11-19 MED ORDER — FERROUS SULFATE 325 (65 FE) MG PO TABS
325.0000 mg | ORAL_TABLET | Freq: Two times a day (BID) | ORAL | Status: DC
  Administered 2017-11-20: 325 mg via ORAL
  Filled 2017-11-19 (×2): qty 1

## 2017-11-19 NOTE — Lactation Note (Signed)
This note was copied from a baby's chart. Lactation Consultation Note  Patient Name: Girl Sakiya Stepka AJOIN'O Date: 11/19/2017 Reason for consult: Follow-up assessment;NICU baby;Preterm <34wks;Infant < 6lbs   Follow up with mom of 100 hour old NICU infant. Mom reports she pumped 2 x yesterday and has not pumped all night. Enc mom to pump 8-12 x in 24 hours to stimulate milk production. Mom reports she knows how to hand express, enc her to hand express post pumping. Reviewed milk coming to volume.   Mom reports she has no further questions/concerns at this time.    Maternal Data Formula Feeding for Exclusion: No Has patient been taught Hand Expression?: Yes  Feeding    LATCH Score                   Interventions    Lactation Tools Discussed/Used Pump Review: Setup, frequency, and cleaning Initiated by:: Reviewed and encouraged 8-12 x a day and follow with hand expression   Consult Status Consult Status: Follow-up Date: 11/20/17 Follow-up type: In-patient    Debby Freiberg Betsi Crespi 11/19/2017, 9:22 AM

## 2017-11-19 NOTE — Progress Notes (Addendum)
Subjective: Postpartum Day 1: Vaginal delivery, 1 degree vaginal laceration repaired, cervical laceration not repaired due to hemostasis.  Two rt Labial and two left labial lacs. Pt endorses feels in a lot lot pain when sitting and standing, pt grimaces while moving. Pt denies using ice to the perineum, denies taking motrin and tylenol  for pain control.  Suture Repair: 2.0 3.0 vicryl  Patient up ad lib, reports no syncope or dizziness. Feeding:  Pt pumping, infant in NICU, seen and assessed by lactation.  Contraceptive plan:  Abstinence.  Pt walking back and forth to visit infant in NICU , pt appears in NAD and denies any questions.   Objective: Vital signs in last 24 hours: Temp:  [97.4 F (36.3 C)-98.2 F (36.8 C)] 97.8 F (36.6 C) (05/08 0830) Pulse Rate:  [72-99] 72 (05/08 0830) Resp:  [14-18] 16 (05/08 0830) BP: (98-119)/(59-78) 105/78 (05/08 0830) SpO2:  [99 %-100 %] 100 % (05/08 0830)  Physical Exam:  General: alert, cooperative and appears stated age, pt grimaces with pain upon sitting and standing.  Lochia: appropriate Uterine Fundus: firm Perineum: healing well, no significant drainage, no dehiscence, no significant erythema, no pain to palpate external vaginal area, no hemorrhoids present. No visible sutures on external vagina. No hematomas present. No active frank bleeding occurring.  DVT Evaluation: No evidence of DVT seen on physical exam. Negative Homan's sign. No cords or calf tenderness. No significant calf/ankle edema.   CBC Latest Ref Rng & Units 11/19/2017 11/17/2017 08/29/2017  WBC 4.0 - 10.5 K/uL 16.9(H) 12.4(H) 10.8(H)  Hemoglobin 12.0 - 15.0 g/dL 10.2(L) 11.2(L) 12.3  Hematocrit 36.0 - 46.0 % 29.3(L) 32.0(L) 35.2(L)  Platelets 150 - 400 K/uL 316 342 322     Assessment/Plan: Status post vaginal delivery day 1. Stable Continue current care. Plan for discharge tomorrow, Breastfeeding, Lactation consult and Contraception abstenice for now , will f/u for [facility]  plan at 6 weeks PP visit.   Consulted with MD Dillard about pts pain in vaginal area. Agreed to plan of care, that since HGB & HCT only dropped slightly due to normal physiological postpartum and delivery bleeding, that pt was instructed to be monitored, utilize ice to the perineum, take PO motrin with tylenol for inflammation and pain control. Will perform [place] and recheck Hgb if pain increases and is not tolerable.  Pt considered anemic and started on iron PO today.     Alta Goding NP-C 11/19/2017, 1:32 PM

## 2017-11-20 LAB — CULTURE, BETA STREP (GROUP B ONLY)

## 2017-11-20 MED ORDER — RHO D IMMUNE GLOBULIN 1500 UNIT/2ML IJ SOSY
300.0000 ug | PREFILLED_SYRINGE | Freq: Once | INTRAMUSCULAR | Status: AC
  Administered 2017-11-20: 300 ug via INTRAVENOUS
  Filled 2017-11-20: qty 2

## 2017-11-20 MED ORDER — IBUPROFEN 600 MG PO TABS: 600.0000 mg | ORAL_TABLET | Freq: Four times a day (QID) | ORAL | 0 refills | Status: DC

## 2017-11-20 MED ORDER — FERROUS SULFATE 325 (65 FE) MG PO TABS: 325.0000 mg | ORAL_TABLET | Freq: Two times a day (BID) | ORAL | 3 refills | Status: DC

## 2017-11-20 NOTE — Lactation Note (Signed)
This note was copied from a baby's chart. Lactation Consultation Note  Patient Name: Brandi Atkinson HFWYO'V Date: 11/20/2017 Reason for consult: Follow-up assessment;NICU baby;Preterm <34wks;Infant < 6lbs;1st time breastfeeding   Follow up with mom while she was visiting with [place] in the NICU. Mom has not been pumping regularly and has hand expressed some. Mom reports she was able to obtain a drop or 2 of clear fluid this morning. Discussed with mom that colostrum can be clear and that is normal. Enc mom to save all gtts of colostrum and take to NICU.   Mom asked if hand expression is enough, enc mom to pump and follow with hand expression for best results. Reviewed supply and demand and milk coming to volume.   Mom is planning to go to [facility] to pick up her pump that is provided through her employer, Enc mom to get as soon as she can. Mom given manual pump and shown how to use and clean. Enc mom to use NICU pumps while visiting infant in the NICU and use her pump at home. Helped mom pack up her tubing to take home.   Mom has colostrum collection containers to take home with her. Engorgement prevention/treatment reviewed. Mom reports she has no questions/concerns at this time. Mom has [place] phone # to call with any questions/concerns.   Maternal Data Formula Feeding for Exclusion: No Has patient been taught Hand Expression?: Yes Does the patient have breastfeeding experience prior to this delivery?: No  Feeding Feeding Type: Donor Breast Milk  LATCH Score                   Interventions    Lactation Tools Discussed/Used Pump Review: Setup, frequency, and cleaning;Milk Storage Initiated by:: reviewed and encouraged 8-12 x in 24 hours and follow with hand expression   Consult Status Consult Status: Complete Follow-up type: Call as needed    Donn Pierini 11/20/2017, 11:42 AM

## 2017-11-20 NOTE — Progress Notes (Signed)
Discharged to home  Teaching complete

## 2017-11-20 NOTE — Discharge Summary (Signed)
OB Discharge Summary     Patient Name: Brandi Atkinson DOB: 14-Sep-1985 MRN: [id number]  Date of admission: 11/17/2017 Delivering MD: Everett Graff   Date of discharge: 11/20/2017  Admitting diagnosis: 67WKS LEAKING FLUID Intrauterine pregnancy: [redacted]w[redacted]d     Secondary diagnosis:  Active Problems:   Preterm premature rupture of membranes (PPROM) with unknown onset of labor   [redacted] weeks gestation of pregnancy  Additional problems: PTB, baby birth through cervical cerclage, cervical tear with no repair due to hemostasis.      Discharge diagnosis: Preterm Pregnancy Delivered                                                                                                Post partum procedures:none  Augmentation: none  Complications: None  Hospital course:  Onset of Labor With Vaginal Delivery     32 y.o. yo G1P0101 at [redacted]w[redacted]d was admitted in Active Labor on 11/17/2017. Patient had an uncomplicated labor course as follows:  Membrane Rupture Time/Date:   ,11/17/2017   Intrapartum Procedures: Episiotomy: None [1]                                         Lacerations:  1st degree [2];Vaginal [6];Labial [10]  Patient had a delivery of a Viable infant. 11/18/2017  Information for the patient's newborn:  Cicley, Ganesh [[id number]]  Delivery Method: Vaginal, Spontaneous(Filed from Delivery Summary)    Pateint had an uncomplicated postpartum course.  She is ambulating, tolerating a regular diet, passing flatus, and urinating well. Patient is discharged home in stable condition on 11/20/17.   Physical exam  Vitals:   11/19/17 1600 11/19/17 2148 11/20/17 0450 11/20/17 0727  BP: 112/72 104/72 109/73 100/74  Pulse: 67 67 74   Resp: 16 18 17 18   Temp: 98.6 F (37 C) 98.2 F (36.8 C) 98 F (36.7 C) 98.4 F (36.9 C)  TempSrc: Oral   Oral  SpO2: 100% 100% 100%   Weight:      Height:       General: alert, cooperative and no distress Lochia: appropriate Uterine Fundus: firm Incision: N/A DVT  Evaluation: No evidence of DVT seen on physical exam. Negative Homan's sign. No cords or calf tenderness. No significant calf/ankle edema. Labs: Lab Results  Component Value Date   WBC 16.9 (H) 11/19/2017   HGB 10.2 (L) 11/19/2017   HCT 29.3 (L) 11/19/2017   MCV 86.4 11/19/2017   PLT 316 11/19/2017   No flowsheet data found.  Discharge instruction: per After Visit Summary and "Baby and Me Booklet".  After visit meds:  Allergies as of 11/20/2017   No Known Allergies     Medication List    STOP taking these medications   DICLEGIS PO   ketoconazole 2 % cream Commonly known as:  NIZORAL   metroNIDAZOLE 500 MG tablet Commonly known as:  FLAGYL   progesterone 200 MG capsule Commonly known as:  PROMETRIUM     TAKE these medications   calcium carbonate  500 MG chewable tablet Commonly known as:  TUMS - dosed in mg elemental calcium Chew 2 tablets by mouth daily as needed for indigestion or heartburn.   ferrous sulfate 325 (65 FE) MG tablet Take 1 tablet (325 mg total) by mouth 2 (two) times daily with a meal.   ibuprofen 600 MG tablet Commonly known as:  ADVIL,MOTRIN Take 1 tablet (600 mg total) by mouth every 6 (six) hours.   PRENATAL VITAMIN PO Take 1 tablet by mouth daily.       Diet: routine diet  Activity: Advance as tolerated. Pelvic rest for 6 weeks.   Outpatient follow up:1 week vaginal check  and 6 weeks  Follow up Appt: Future Appointments  Date Time Provider [place]  12/17/2017 10:30 AM WH-MFC [place] 1 WH-MFCUS MFC-US   Follow up Visit:No follow-ups on file.  Postpartum contraception: Abstinence  Newborn Data: Live born female  Birth Weight: 3 lb 14.1 oz (1760 g) APGAR: 9, 10  Newborn Delivery   Birth date/time:  11/18/2017 06:13:00 Delivery type:  Vaginal, Spontaneous     Baby Feeding: Bottle and Breast Disposition:NICU  Pt discharge to home in stable condition , pt instructed to take motrin and tylenol and ice pad to perineum for  pain control.    11/20/2017 Noralyn Pick, FNP

## 2017-11-20 NOTE — Clinical Social Work Maternal (Addendum)
CLINICAL SOCIAL WORK MATERNAL/CHILD NOTE  Patient Details  Name: Brandi Atkinson MRN: [id number] Date of Birth: [birth year]  Date:  11/20/2017  Clinical Social Worker Initiating Note:  Laurey Arrow Date/Time: Initiated:  11/20/17/1446     Child's Name:  Brandi Atkinson   Biological Parents:  Mother, Father   Need for Interpreter:  None   Reason for Referral:  Parental Support of Premature Babies < 32 weeks/or Critically Ill babies   Address:  [place] [address]    Phone number:  [phone] (home)     Additional phone number:     Household Members/Support Persons (HM/SP):   Household Member/Support Person 1   HM/SP Name Relationship DOB or Age  HM/SP -1 Brandi Atkinson FOB  09/05/1987  HM/SP -2        HM/SP -3        HM/SP -4        HM/SP -5        HM/SP -6        HM/SP -7        HM/SP -8          Natural Supports (not living in the home):  Immediate Family, Extended Family, Spouse/significant other, Other (Comment), Parent(Per MOB, FOB's family will also provide support. )   Professional Supports: None   Employment: Full-time   Type of Work: Marine scientist at [employer]   Education:  Vocation/technical training   Homebound arranged:    Museum/gallery curator Resources:  Multimedia programmer   Other Resources:      Cultural/Religious Considerations Which May Impact Care:  None Reported  Strengths:  Ability to meet basic needs , Home prepared for child (MOB does not have a carseat at this time but has agreed to contact [place] if family needs assistance with purchasing one. )   Psychotropic Medications:         Pediatrician:       Pediatrician List:   [place]      Pediatrician Fax Number:    Risk Factors/Current Problems:      Cognitive State:  Alert , Able to Concentrate , Linear Thinking , Insightful    Mood/Affect:  Bright , Happy , Comfortable ,  Relaxed , Interested    CSW Assessment:  CSW met with MOB at infant's bedside to complete an assessment for infant NICU admission.  When CSW arrived, MOB was holding infant skin to skin and appeared comfortable.  It was evidence by MOB's facialy expression and smile that MOB is excited about being a new mother. MOB was inviting, polite, and receptive to meeting with CSW.  MOB denied any MH and SA hx.  CSW inquired about barriers, needs, and concerns and MOB denied them.  MOB communicated that they have everything they need for infant with the exception of a car seat.  CSW made MOB aware of resources that are available to family if MOB is unable to purchase a car seat.  MOB agreed to contact CSW if help is needed.   CSW inquired about MOB's thoughts and feelings regarding infant's NICU admission. n the NICU.  MOB shared feelings of being surprised and scared initially, however, feels better since infant is "OK." CSW normalized and validated MOB's thoughts and feelings and discussed common emotions often experienced related to a NICU admission as well as during the first  couple weeks of the postpartum period.  CSW reviewed NICU visitation policy and encouraged MOB to visit as often as she likes.  CSW will continue to assess for psychosocial stressors while infant remains in NICU.  Please contact the Clinical Social Worker if specific needs arise, or requested from MOB.  CSW Plan/Description:  Psychosocial Support and Ongoing Assessment of Needs, Perinatal Mood and Anxiety Disorder (PMADs) Education, Other Information/Referral to [employer], MSW, [employer] Social Work [phone]  Dimple Nanas, LCSW 11/20/2017, 2:59 PM

## 2017-11-21 LAB — RH IG WORKUP (INCLUDES ABO/RH)
ABO/RH(D): O NEG
FETAL SCREEN: POSITIVE
GESTATIONAL AGE(WKS): 31
Unit division: 0

## 2017-11-26 ENCOUNTER — Ambulatory Visit: Payer: Self-pay

## 2017-11-26 NOTE — Lactation Note (Signed)
This note was copied from a baby's chart. Lactation Consultation Note  Patient Name: Brandi Atkinson BMWUX'L Date: 11/26/2017 Reason for consult: Follow-up assessment;NICU baby;Preterm <34wks   Follow up with mom of 1 day old infant in the NICU. Mom is concerned she is only getting drops of milk . Mom reports she did get  really full feeling a few days ago. Mom holding infant STS, Enc her to do as able to help with milk production.   Mom is pumping 2-3 x a day with a manual pump, pumping each breast for 15 minutes each. She did get a DEBP from [employer] but has not been using. Mom also has pump parts to use the [employer] pump in the NICU. Enc her to pump after visiting and holding infant in the NICU pumping rooms.   Reviewed with mom supply and demand and importance pf breast stimulation and emptying to make milk. Advised pumping 8-12 x a day for 15-20 minutes with DEBP and follow with hand expression.   Mom asked about Lactation cookies and shakes. Discussed that nothing will work without pumping and breast emptying. Lactation Cookie Recipe give. Information on Fenugreek given to talk with OB prior to taking.   Mom reports all questions/concerns have been answered.    Maternal Data Formula Feeding for Exclusion: No Has patient been taught Hand Expression?: Yes Does the patient have breastfeeding experience prior to this delivery?: No  Feeding Feeding Type: Donor Breast Milk  LATCH Score                   Interventions    Lactation Tools Discussed/Used WIC Program: No Pump Review: Setup, frequency, and cleaning;Milk Storage Initiated by:: Reviewed and encouraged 8-12 x in 24 hours and follow with hand expression   Consult Status Consult Status: PRN Follow-up type: Call as needed    Donn Pierini 11/26/2017, 11:36 AM

## 2017-12-17 ENCOUNTER — Ambulatory Visit (HOSPITAL_COMMUNITY)
Admission: RE | Admit: 2017-12-17 | Discharge: 2017-12-17 | Disposition: A | Discharge status: Home / Self Care | Payer: PRIVATE HEALTH INSURANCE | Source: Ambulatory Visit | Attending: Obstetrics & Gynecology | Admitting: Obstetrics & Gynecology

## 2017-12-17 ENCOUNTER — Encounter (HOSPITAL_COMMUNITY): Payer: Self-pay

## 2019-06-16 ENCOUNTER — Ambulatory Visit
Admission: EM | Admit: 2019-06-16 | Discharge: 2019-06-16 | Disposition: A | Discharge status: Home / Self Care | Payer: Medicaid Other | Attending: Emergency Medicine | Admitting: Emergency Medicine

## 2019-06-16 ENCOUNTER — Other Ambulatory Visit: Payer: Self-pay

## 2019-06-16 DIAGNOSIS — L259 Unspecified contact dermatitis, unspecified cause: Secondary | ICD-10-CM

## 2019-06-16 DIAGNOSIS — R21 Rash and other nonspecific skin eruption: Secondary | ICD-10-CM

## 2019-06-16 MED ORDER — TRIAMCINOLONE ACETONIDE 0.1 % EX CREA: 1.0000 "application " | TOPICAL_CREAM | Freq: Two times a day (BID) | CUTANEOUS | 0 refills | Status: DC

## 2019-06-16 NOTE — ED Provider Notes (Signed)
[facility]   WC:[id number] 06/16/19 Arrival Time: [id number]  CC: Rash  SUBJECTIVE:  Brandi Atkinson is a 33 y.o. female who presents with a intermittent rash on RT hand x 2 weeks.  Denies precipitating event or trauma.  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  No known patients with scabies. Localizes the rash to back of RT hand.  Describes it as itchy, raised and red.  Has NOT tried OTC medications.  Worse with being outside.  Denies similar symptoms in the past.   Denies fever, chills, nausea, vomiting, swelling, discharge.  ROS: As per HPI.  All other pertinent ROS negative.     Past Medical History:  Diagnosis Date  . Anemia   . Bilateral ovarian cysts   . Fibroids   . GERD (gastroesophageal reflux disease)   . Mitral valve prolapse   . Sickle cell trait [facility])    Past Surgical History:  Procedure Laterality Date  . CERVICAL CERCLAGE Bilateral 08/29/2017   Procedure: CERCLAGE CERVICAL;  Surgeon: Everett Graff, MD;  Location: [facility];  Service: Gynecology;  Laterality: Bilateral;  . OVARIAN CYST REMOVAL    . WISDOM TOOTH EXTRACTION  2011   No Known Allergies No current facility-administered medications on file prior to encounter.    Current Outpatient Medications on File Prior to Encounter  Medication Sig Dispense Refill  . calcium carbonate (TUMS - DOSED IN MG ELEMENTAL CALCIUM) 500 MG chewable tablet Chew 2 tablets by mouth daily as needed for indigestion or heartburn.    . ferrous sulfate 325 (65 FE) MG tablet Take 1 tablet (325 mg total) by mouth 2 (two) times daily with a meal. 60 tablet 3  . ibuprofen (ADVIL,MOTRIN) 600 MG tablet Take 1 tablet (600 mg total) by mouth every 6 (six) hours. 30 tablet 0  . Prenatal Vit-Fe Fumarate-FA (PRENATAL VITAMIN PO) Take 1 tablet by mouth daily.      Social History   Socioeconomic History  . Marital status: Unknown    Spouse name: Not on file  . Number of children: Not on file  . Years of  education: Not on file  . Highest education level: Not on file  Occupational History  . Not on file  Social Needs  . Financial resource strain: Not on file  . Food insecurity    Worry: Not on file    Inability: Not on file  . Transportation needs    Medical: Not on file    Non-medical: Not on file  Tobacco Use  . Smoking status: Never Smoker  . Smokeless tobacco: Never Used  Substance and Sexual Activity  . Alcohol use: No  . Drug use: No  . Sexual activity: Yes    Birth control/protection: None  Lifestyle  . Physical activity    Days per week: Not on file    Minutes per session: Not on file  . Stress: Not on file  Relationships  . Social Herbalist on phone: Not on file    Gets together: Not on file    Attends religious service: Not on file    Active member of club or organization: Not on file    Attends meetings of clubs or organizations: Not on file    Relationship status: Not on file  . Intimate partner violence    Fear of current or ex partner: Not on file    Emotionally abused: Not on file    Physically abused: Not  on file    Forced sexual activity: Not on file  Other Topics Concern  . Not on file  Social History Narrative  . Not on file   Family History  Problem Relation Age of Onset  . Hyperlipidemia Mother   . Hypertension Mother   . Hyperlipidemia Father   . Hypertension Father     OBJECTIVE: Vitals:   06/16/19 1553  BP: 123/84  Pulse: 85  Resp: 18  Temp: 98.4 F (36.9 C)  SpO2: 97%    General appearance: alert; no distress Head: NCAT Lungs: Normal respiratory effort Heart: Radial pulse 2+ bilaterally Extremities: no edema Skin: warm and dry; no obvious rash to dorsal aspect of RT hand over medial carpals and proximal first digit; no rash to interdigit/ web spaces Psychological: alert and cooperative; normal mood and affect  ASSESSMENT & PLAN:  1. Rash of hand   2. Contact dermatitis and eczema     Meds ordered this  encounter  Medications  . triamcinolone cream (KENALOG) 0.1 %    Sig: Apply 1 application topically 2 (two) times daily.    Dispense:  30 g    Refill:  0    Order Specific Question:   Supervising Provider    Answer:   Raylene Everts [id number]   Symptoms sound consistent with eczema, possibly dyshidrotic eczema Triamcinolone cream prescribed.  Use as directed for symptomatic relief Wash with warm water and mild soap Moisturize skin daily Follow up with PCP if symptoms persists Return or go to the ER if you have any new or worsening symptoms such as fever, chills, nausea, vomiting, redness, swelling, discharge, if symptoms do not improve with medications, etc...  Reviewed expectations re: course of current medical issues. Questions answered. Outlined signs and symptoms indicating need for more acute intervention. Patient verbalized understanding. After Visit Summary given.   Lestine Box, PA-C 06/16/19 1828

## 2019-06-16 NOTE — ED Triage Notes (Signed)
Pt presents with rash on right hand that she has had for about 2 weeks

## 2019-06-16 NOTE — Discharge Instructions (Addendum)
Symptoms sound consistent with eczema, possibly dyshidrotic eczema Triamcinolone cream prescribed.  Use as directed for symptomatic relief Wash with warm water and mild soap Moisturize skin daily Follow up with PCP if symptoms persists Return or go to the ER if you have any new or worsening symptoms such as fever, chills, nausea, vomiting, redness, swelling, discharge, if symptoms do not improve with medications, etc..Marland Kitchen

## 2020-01-13 DIAGNOSIS — Z419 Encounter for procedure for purposes other than remedying health state, unspecified: Secondary | ICD-10-CM | POA: Diagnosis not present

## 2020-02-13 DIAGNOSIS — Z419 Encounter for procedure for purposes other than remedying health state, unspecified: Secondary | ICD-10-CM | POA: Diagnosis not present

## 2020-03-15 DIAGNOSIS — Z419 Encounter for procedure for purposes other than remedying health state, unspecified: Secondary | ICD-10-CM | POA: Diagnosis not present

## 2020-04-14 DIAGNOSIS — Z419 Encounter for procedure for purposes other than remedying health state, unspecified: Secondary | ICD-10-CM | POA: Diagnosis not present

## 2020-05-15 DIAGNOSIS — Z419 Encounter for procedure for purposes other than remedying health state, unspecified: Secondary | ICD-10-CM | POA: Diagnosis not present

## 2020-06-14 DIAGNOSIS — Z419 Encounter for procedure for purposes other than remedying health state, unspecified: Secondary | ICD-10-CM | POA: Diagnosis not present

## 2020-07-15 DIAGNOSIS — Z419 Encounter for procedure for purposes other than remedying health state, unspecified: Secondary | ICD-10-CM | POA: Diagnosis not present

## 2020-08-15 DIAGNOSIS — Z419 Encounter for procedure for purposes other than remedying health state, unspecified: Secondary | ICD-10-CM | POA: Diagnosis not present

## 2020-08-17 HISTORY — PX: BREAST REDUCTION SURGERY: SHX8

## 2020-09-12 DIAGNOSIS — Z419 Encounter for procedure for purposes other than remedying health state, unspecified: Secondary | ICD-10-CM | POA: Diagnosis not present

## 2020-10-13 DIAGNOSIS — Z419 Encounter for procedure for purposes other than remedying health state, unspecified: Secondary | ICD-10-CM | POA: Diagnosis not present

## 2020-11-12 DIAGNOSIS — Z419 Encounter for procedure for purposes other than remedying health state, unspecified: Secondary | ICD-10-CM | POA: Diagnosis not present

## 2020-12-13 DIAGNOSIS — Z419 Encounter for procedure for purposes other than remedying health state, unspecified: Secondary | ICD-10-CM | POA: Diagnosis not present

## 2021-01-12 DIAGNOSIS — Z419 Encounter for procedure for purposes other than remedying health state, unspecified: Secondary | ICD-10-CM | POA: Diagnosis not present

## 2021-02-12 DIAGNOSIS — Z419 Encounter for procedure for purposes other than remedying health state, unspecified: Secondary | ICD-10-CM | POA: Diagnosis not present

## 2021-03-15 DIAGNOSIS — Z419 Encounter for procedure for purposes other than remedying health state, unspecified: Secondary | ICD-10-CM | POA: Diagnosis not present

## 2021-04-14 DIAGNOSIS — Z419 Encounter for procedure for purposes other than remedying health state, unspecified: Secondary | ICD-10-CM | POA: Diagnosis not present

## 2021-05-15 DIAGNOSIS — Z419 Encounter for procedure for purposes other than remedying health state, unspecified: Secondary | ICD-10-CM | POA: Diagnosis not present

## 2021-06-14 DIAGNOSIS — Z419 Encounter for procedure for purposes other than remedying health state, unspecified: Secondary | ICD-10-CM | POA: Diagnosis not present

## 2021-06-22 ENCOUNTER — Other Ambulatory Visit: Payer: Self-pay

## 2021-06-22 ENCOUNTER — Ambulatory Visit
Admission: EM | Admit: 2021-06-22 | Discharge: 2021-06-22 | Disposition: A | Discharge status: Home / Self Care | Payer: Medicaid Other

## 2021-06-22 ENCOUNTER — Encounter: Payer: Self-pay | Admitting: Emergency Medicine

## 2021-06-22 DIAGNOSIS — J069 Acute upper respiratory infection, unspecified: Secondary | ICD-10-CM

## 2021-06-22 LAB — POCT RAPID STREP A (OFFICE): Rapid Strep A Screen: NEGATIVE

## 2021-06-22 MED ORDER — PROMETHAZINE-DM 6.25-15 MG/5ML PO SYRP: 5.0000 mL | ORAL_SOLUTION | Freq: Four times a day (QID) | ORAL | 0 refills | Status: DC | PRN

## 2021-06-22 MED ORDER — LIDOCAINE VISCOUS HCL 2 % MT SOLN: 10.0000 mL | OROMUCOSAL | 0 refills | Status: DC | PRN

## 2021-06-22 NOTE — ED Provider Notes (Signed)
[facility]    CSN: [id number] Arrival date & time: 06/22/21  9163      History   Chief Complaint Chief Complaint  Patient presents with   Sore Throat   Cough   HPI Brandi Atkinson is a 35 y.o. female.   Presenting today with 4-day history of sore throat, mild cough, hoarseness, painful swallowing, fatigue.  Denies cough, chest pain, shortness of breath, abdominal pain, nausea vomiting or diarrhea.  So far taking ibuprofen and Tylenol with temporary benefit.  No known sick contacts recently other than daughter recently had a GI type illness.  Past Medical History:  Diagnosis Date   Anemia    Bilateral ovarian cysts    Fibroids    GERD (gastroesophageal reflux disease)    Mitral valve prolapse    Sickle cell trait [facility])    Patient Active Problem List   Diagnosis Date Noted   [redacted] weeks gestation of pregnancy 11/18/2017   Preterm premature rupture of membranes (PPROM) with unknown onset of labor 11/17/2017   [redacted] weeks gestation of pregnancy    Encounter for fetal anatomic survey    Poor clinical fetal growth    [redacted] weeks gestation of pregnancy 08/29/2017   Short cervical length during pregnancy 08/29/2017    Past Surgical History:  Procedure Laterality Date   CERVICAL CERCLAGE Bilateral 08/29/2017   Procedure: CERCLAGE CERVICAL;  Surgeon: Everett Graff, MD;  Location: [facility];  Service: Gynecology;  Laterality: Bilateral;   OVARIAN CYST REMOVAL     WISDOM TOOTH EXTRACTION  2011   OB History     Gravida  1   Para  1   Term      Preterm  1   AB      Living  1      SAB      IAB      Ectopic      Multiple  0   Live Births  1           Home Medications    Prior to Admission medications   Medication Sig Start Date End Date Taking? Authorizing Provider  Iron-Vitamins (GERITOL COMPLETE PO) Take by mouth.   Yes [provider]  lidocaine (XYLOCAINE) 2 % solution Use as directed 10 mLs in the mouth or throat as needed for mouth  pain. 06/22/21  Yes Volney American, PA-C  promethazine-dextromethorphan (PROMETHAZINE-DM) 6.25-15 MG/5ML syrup Take 5 mLs by mouth 4 (four) times daily as needed. 06/22/21  Yes Volney American, PA-C  calcium carbonate (TUMS - DOSED IN MG ELEMENTAL CALCIUM) 500 MG chewable tablet Chew 2 tablets by mouth daily as needed for indigestion or heartburn.    [provider]  ferrous sulfate 325 (65 FE) MG tablet Take 1 tablet (325 mg total) by mouth 2 (two) times daily with a meal. 11/20/17 11/20/18  [place], Luvenia Starch, FNP  ibuprofen (ADVIL,MOTRIN) 600 MG tablet Take 1 tablet (600 mg total) by mouth every 6 (six) hours. 11/20/17   Noralyn Pick, [place]  Prenatal Vit-Fe Fumarate-FA (PRENATAL VITAMIN PO) Take 1 tablet by mouth daily.     [provider]  triamcinolone cream (KENALOG) 0.1 % Apply 1 application topically 2 (two) times daily. 06/16/19   Lestine Box, PA-C    Family History Family History  Problem Relation Age of Onset   Hyperlipidemia Mother    Hypertension Mother    Hyperlipidemia Father    Hypertension Father     Social History Social History  Tobacco Use   Smoking status: Never   Smokeless tobacco: Never  Vaping Use   Vaping Use: Never used  Substance Use Topics   Alcohol use: No   Drug use: No     Allergies   Patient has no known allergies.   Review of Systems Review of Systems PER HPI   Physical Exam Triage Vital Signs ED Triage Vitals  Enc Vitals Group     BP 06/22/21 0953 113/81     Pulse Rate 06/22/21 0953 96     Resp 06/22/21 0953 16     Temp 06/22/21 0953 98.7 F (37.1 C)     Temp Source 06/22/21 0953 Oral     SpO2 06/22/21 0953 97 %     Weight --      Height --      Head Circumference --      Peak Flow --      Pain Score 06/22/21 0954 7     Pain Loc --      Pain Edu? --      Excl. in [place]? --    No data found.  Updated Vital Signs BP 113/81 (BP Location: Right Arm)   Pulse 96   Temp 98.7 F (37.1 C) (Oral)   Resp 16    LMP 06/20/2021 (Exact Date)   SpO2 97%   Breastfeeding No   Visual Acuity Right Eye Distance:   Left Eye Distance:   Bilateral Distance:    Right Eye Near:   Left Eye Near:    Bilateral Near:     Physical Exam Vitals and nursing note reviewed.  Constitutional:      Appearance: Normal appearance.  HENT:     Head: Atraumatic.     Right Ear: Tympanic membrane and external ear normal.     Left Ear: Tympanic membrane and external ear normal.     Nose: Rhinorrhea present.     Mouth/Throat:     Mouth: Mucous membranes are moist.     Pharynx: Posterior oropharyngeal erythema present.     Comments: Uvula midline, oral airway patent Eyes:     Extraocular Movements: Extraocular movements intact.     Conjunctiva/sclera: Conjunctivae normal.  Cardiovascular:     Rate and Rhythm: Normal rate and regular rhythm.     Heart sounds: Normal heart sounds.  Pulmonary:     Effort: Pulmonary effort is normal.     Breath sounds: Normal breath sounds. No wheezing or rales.  Musculoskeletal:        General: Normal range of motion.     Cervical back: Normal range of motion and neck supple.  Skin:    General: Skin is warm and dry.  Neurological:     Mental Status: She is alert and oriented to person, place, and time.  Psychiatric:        Mood and Affect: Mood normal.        Thought Content: Thought content normal.   UC Treatments / Results  Labs (all labs ordered are listed, but only abnormal results are displayed) Labs Reviewed  COVID-19, FLU A+B NAA  POCT RAPID STREP A (OFFICE)    EKG   Radiology No results found.  Procedures Procedures (including critical care time)  Medications Ordered in UC Medications - No data to display  Initial Impression / Assessment and Plan / UC Course  I have reviewed the triage vital signs and the nursing notes.  Pertinent labs & imaging results that were available during my care  of the patient were reviewed by me and considered in my medical  decision making (see chart for details).     Vitals and exam overall reassuring, rapid strep negative, throat culture and COVID flu testing pending.  We will treat in the meantime with Phenergan DM, viscous lidocaine.  Return for acutely worsening symptoms.  Final Clinical Impressions(s) / UC Diagnoses   Final diagnoses:  Viral URI   Discharge Instructions   None    ED Prescriptions     Medication Sig Dispense Auth. Provider   promethazine-dextromethorphan (PROMETHAZINE-DM) 6.25-15 MG/5ML syrup Take 5 mLs by mouth 4 (four) times daily as needed. 100 mL Volney American, PA-C   lidocaine (XYLOCAINE) 2 % solution Use as directed 10 mLs in the mouth or throat as needed for mouth pain. 100 mL Volney American, [place]      PDMP not reviewed this encounter.   Volney American, [place] 06/22/21 1115

## 2021-06-22 NOTE — ED Triage Notes (Signed)
Patient c/o sore throat x 4 days. Patient c/o productive cough x 1 day.   Patient denies fever at home.   Patient endorses hoarse voice at times.   Patient endorses painful swallowing.   Patient has taken Tylenol and Ibuprofen with some relief of pain.

## 2021-06-23 LAB — COVID-19, FLU A+B NAA
Influenza A, NAA: NOT DETECTED
Influenza B, NAA: NOT DETECTED
SARS-CoV-2, NAA: NOT DETECTED

## 2021-07-15 DIAGNOSIS — Z419 Encounter for procedure for purposes other than remedying health state, unspecified: Secondary | ICD-10-CM | POA: Diagnosis not present

## 2021-08-15 DIAGNOSIS — Z419 Encounter for procedure for purposes other than remedying health state, unspecified: Secondary | ICD-10-CM | POA: Diagnosis not present

## 2021-09-12 DIAGNOSIS — Z419 Encounter for procedure for purposes other than remedying health state, unspecified: Secondary | ICD-10-CM | POA: Diagnosis not present

## 2021-10-13 DIAGNOSIS — Z419 Encounter for procedure for purposes other than remedying health state, unspecified: Secondary | ICD-10-CM | POA: Diagnosis not present

## 2021-10-31 DIAGNOSIS — R7989 Other specified abnormal findings of blood chemistry: Secondary | ICD-10-CM | POA: Diagnosis not present

## 2021-10-31 DIAGNOSIS — Z0000 Encounter for general adult medical examination without abnormal findings: Secondary | ICD-10-CM | POA: Diagnosis not present

## 2021-10-31 DIAGNOSIS — R102 Pelvic and perineal pain: Secondary | ICD-10-CM | POA: Diagnosis not present

## 2021-10-31 DIAGNOSIS — R945 Abnormal results of liver function studies: Secondary | ICD-10-CM | POA: Diagnosis not present

## 2021-10-31 DIAGNOSIS — Z01419 Encounter for gynecological examination (general) (routine) without abnormal findings: Secondary | ICD-10-CM | POA: Diagnosis not present

## 2021-10-31 DIAGNOSIS — E559 Vitamin D deficiency, unspecified: Secondary | ICD-10-CM | POA: Diagnosis not present

## 2021-10-31 DIAGNOSIS — Z113 Encounter for screening for infections with a predominantly sexual mode of transmission: Secondary | ICD-10-CM | POA: Diagnosis not present

## 2021-10-31 DIAGNOSIS — N809 Endometriosis, unspecified: Secondary | ICD-10-CM | POA: Diagnosis not present

## 2021-10-31 DIAGNOSIS — Z124 Encounter for screening for malignant neoplasm of cervix: Secondary | ICD-10-CM | POA: Diagnosis not present

## 2021-10-31 DIAGNOSIS — N979 Female infertility, unspecified: Secondary | ICD-10-CM | POA: Diagnosis not present

## 2021-11-12 DIAGNOSIS — Z419 Encounter for procedure for purposes other than remedying health state, unspecified: Secondary | ICD-10-CM | POA: Diagnosis not present

## 2021-12-13 DIAGNOSIS — Z419 Encounter for procedure for purposes other than remedying health state, unspecified: Secondary | ICD-10-CM | POA: Diagnosis not present

## 2022-01-03 DIAGNOSIS — R102 Pelvic and perineal pain: Secondary | ICD-10-CM | POA: Diagnosis not present

## 2022-01-03 DIAGNOSIS — Z319 Encounter for procreative management, unspecified: Secondary | ICD-10-CM | POA: Diagnosis not present

## 2022-01-12 DIAGNOSIS — Z419 Encounter for procedure for purposes other than remedying health state, unspecified: Secondary | ICD-10-CM | POA: Diagnosis not present

## 2022-02-12 DIAGNOSIS — Z419 Encounter for procedure for purposes other than remedying health state, unspecified: Secondary | ICD-10-CM | POA: Diagnosis not present

## 2022-03-15 DIAGNOSIS — Z419 Encounter for procedure for purposes other than remedying health state, unspecified: Secondary | ICD-10-CM | POA: Diagnosis not present

## 2022-04-14 DIAGNOSIS — Z419 Encounter for procedure for purposes other than remedying health state, unspecified: Secondary | ICD-10-CM | POA: Diagnosis not present

## 2022-05-15 DIAGNOSIS — Z419 Encounter for procedure for purposes other than remedying health state, unspecified: Secondary | ICD-10-CM | POA: Diagnosis not present

## 2022-06-14 DIAGNOSIS — Z419 Encounter for procedure for purposes other than remedying health state, unspecified: Secondary | ICD-10-CM | POA: Diagnosis not present

## 2022-07-15 DIAGNOSIS — Z419 Encounter for procedure for purposes other than remedying health state, unspecified: Secondary | ICD-10-CM | POA: Diagnosis not present

## 2022-08-15 DIAGNOSIS — Z419 Encounter for procedure for purposes other than remedying health state, unspecified: Secondary | ICD-10-CM | POA: Diagnosis not present

## 2022-09-13 DIAGNOSIS — Z419 Encounter for procedure for purposes other than remedying health state, unspecified: Secondary | ICD-10-CM | POA: Diagnosis not present

## 2022-09-19 ENCOUNTER — Other Ambulatory Visit: Payer: Self-pay

## 2022-09-19 DIAGNOSIS — R102 Pelvic and perineal pain: Secondary | ICD-10-CM

## 2022-09-24 ENCOUNTER — Other Ambulatory Visit: Payer: Self-pay | Admitting: Obstetrics and Gynecology

## 2022-09-24 DIAGNOSIS — R102 Pelvic and perineal pain: Secondary | ICD-10-CM

## 2022-10-14 DIAGNOSIS — Z419 Encounter for procedure for purposes other than remedying health state, unspecified: Secondary | ICD-10-CM | POA: Diagnosis not present

## 2022-10-16 ENCOUNTER — Ambulatory Visit
Admission: RE | Admit: 2022-10-16 | Discharge: 2022-10-16 | Disposition: A | Discharge status: Home / Self Care | Payer: Managed Care, Other (non HMO) | Source: Ambulatory Visit | Attending: Obstetrics and Gynecology | Admitting: Obstetrics and Gynecology

## 2022-10-16 DIAGNOSIS — R102 Pelvic and perineal pain: Secondary | ICD-10-CM

## 2022-10-21 ENCOUNTER — Ambulatory Visit: Payer: Self-pay

## 2022-11-05 DIAGNOSIS — D259 Leiomyoma of uterus, unspecified: Secondary | ICD-10-CM | POA: Diagnosis not present

## 2022-11-05 DIAGNOSIS — Z0000 Encounter for general adult medical examination without abnormal findings: Secondary | ICD-10-CM | POA: Diagnosis not present

## 2022-11-05 DIAGNOSIS — Z01419 Encounter for gynecological examination (general) (routine) without abnormal findings: Secondary | ICD-10-CM | POA: Diagnosis not present

## 2022-11-05 DIAGNOSIS — Z319 Encounter for procreative management, unspecified: Secondary | ICD-10-CM | POA: Diagnosis not present

## 2022-11-05 DIAGNOSIS — N80109 Endometriosis of ovary, unspecified side, unspecified depth: Secondary | ICD-10-CM | POA: Diagnosis not present

## 2022-11-05 DIAGNOSIS — Z124 Encounter for screening for malignant neoplasm of cervix: Secondary | ICD-10-CM | POA: Diagnosis not present

## 2022-11-13 DIAGNOSIS — Z419 Encounter for procedure for purposes other than remedying health state, unspecified: Secondary | ICD-10-CM | POA: Diagnosis not present

## 2022-11-14 NOTE — Progress Notes (Signed)
Called pt for pre-op call and when I told her what I was calling about, she stated she did not know anything about having surgery tomorrow. She stated the last that she had heard about the surgery that it would be the end of May. I called Dr. Su Hilt scheduler and it was after 4 and she was gone for the day. I have sent to secure chats to Dr. Su Hilt without response. I called pt back to make sure that she wanted to cancel tomorrow's surgery and she said she did because she needed to talk more about what Dr. Su Hilt is going to do and she needed to be able to plan for the surgery. I have called Dr. Su Hilt and left a message stating this and asked that she return my call.

## 2022-11-15 ENCOUNTER — Inpatient Hospital Stay (HOSPITAL_COMMUNITY)
Admission: RE | Admit: 2022-11-15 | Payer: Managed Care, Other (non HMO) | Source: Home / Self Care | Admitting: Obstetrics and Gynecology

## 2022-12-02 ENCOUNTER — Inpatient Hospital Stay: Admit: 2022-12-02 | Payer: Managed Care, Other (non HMO) | Admitting: Obstetrics and Gynecology

## 2022-12-02 ENCOUNTER — Encounter (HOSPITAL_COMMUNITY): Admission: RE | Payer: Self-pay | Source: Home / Self Care

## 2022-12-02 SURGERY — EXCISION, CYST, OVARY, LAPAROSCOPIC
Anesthesia: General | Laterality: Right

## 2022-12-14 DIAGNOSIS — Z419 Encounter for procedure for purposes other than remedying health state, unspecified: Secondary | ICD-10-CM | POA: Diagnosis not present

## 2023-01-13 DIAGNOSIS — Z419 Encounter for procedure for purposes other than remedying health state, unspecified: Secondary | ICD-10-CM | POA: Diagnosis not present

## 2023-02-13 DIAGNOSIS — Z419 Encounter for procedure for purposes other than remedying health state, unspecified: Secondary | ICD-10-CM | POA: Diagnosis not present

## 2023-03-16 DIAGNOSIS — Z419 Encounter for procedure for purposes other than remedying health state, unspecified: Secondary | ICD-10-CM | POA: Diagnosis not present

## 2023-04-15 DIAGNOSIS — Z419 Encounter for procedure for purposes other than remedying health state, unspecified: Secondary | ICD-10-CM | POA: Diagnosis not present

## 2023-05-16 DIAGNOSIS — Z419 Encounter for procedure for purposes other than remedying health state, unspecified: Secondary | ICD-10-CM | POA: Diagnosis not present

## 2023-05-26 ENCOUNTER — Ambulatory Visit: Admit: 2023-05-26 | Payer: Managed Care, Other (non HMO) | Admitting: Obstetrics and Gynecology

## 2023-05-26 SURGERY — LAPAROSCOPIC OVARIAN CYSTECTOMY
Anesthesia: General | Laterality: Right

## 2023-06-15 DIAGNOSIS — Z419 Encounter for procedure for purposes other than remedying health state, unspecified: Secondary | ICD-10-CM | POA: Diagnosis not present

## 2023-07-01 ENCOUNTER — Ambulatory Visit
Admission: RE | Admit: 2023-07-01 | Discharge: 2023-07-01 | Disposition: A | Discharge status: Home / Self Care | Payer: Commercial Managed Care - PPO | Source: Ambulatory Visit | Attending: Nurse Practitioner | Admitting: Nurse Practitioner

## 2023-07-01 VITALS — BP 109/72 | HR 84 | Temp 98.5°F | Resp 18

## 2023-07-01 DIAGNOSIS — R21 Rash and other nonspecific skin eruption: Secondary | ICD-10-CM

## 2023-07-01 MED ORDER — KETOCONAZOLE 2 % EX CREA: 1.0000 | TOPICAL_CREAM | Freq: Every day | CUTANEOUS | 0 refills | Status: DC

## 2023-07-01 NOTE — Discharge Instructions (Signed)
Apply medication as prescribed. May take over-the-counter Zyrtec to help with itching. Avoid hot baths or showers while symptoms persist.  Recommend taking lukewarm baths. May apply cool cloths to the area to help with itching or discomfort. Avoid scratching, rubbing, or manipulating the areas while symptoms persist. Follow-up with dermatology as scheduled. Follow-up as needed.

## 2023-07-01 NOTE — ED Triage Notes (Signed)
Rash on scalp and face x 4 days.

## 2023-07-01 NOTE — ED Provider Notes (Signed)
[facility]    CSN: [id number] Arrival date & time: 07/01/23  1844      History   Chief Complaint Chief Complaint  Patient presents with   Rash    Rash to face. - Entered by patient    HPI Brandi Atkinson is a 37 y.o. female.   The history is provided by the patient.   Patient presents for complaints of rash to the scalp and above her eyes have been present for the past several weeks.  Patient states that she has been using over-the-counter moisturizers for symptoms.  She describes the rash as "itchy".  Patient states that she has been diagnosed with a skin condition, she states that it starts with a "p".  Patient denies exposure to new soaps, medications, lotions, foods, or detergents.  Patient states she has controlled symptoms in the past with ketoconazole cream.  Patient states she is scheduled to see dermatology in February of next year.  Past Medical History:  Diagnosis Date   Anemia    Bilateral ovarian cysts    Fibroids    GERD (gastroesophageal reflux disease)    Mitral valve prolapse    Sickle cell trait [facility])     Patient Active Problem List   Diagnosis Date Noted   [redacted] weeks gestation of pregnancy 11/18/2017   Preterm premature rupture of membranes (PPROM) with unknown onset of labor 11/17/2017   [redacted] weeks gestation of pregnancy    Encounter for fetal anatomic survey    Poor clinical fetal growth    [redacted] weeks gestation of pregnancy 08/29/2017   Short cervical length during pregnancy 08/29/2017    Past Surgical History:  Procedure Laterality Date   CERVICAL CERCLAGE Bilateral 08/29/2017   Procedure: CERCLAGE CERVICAL;  Surgeon: Osborn Coho, MD;  Location: [facility];  Service: Gynecology;  Laterality: Bilateral;   OVARIAN CYST REMOVAL     WISDOM TOOTH EXTRACTION  2011    OB History     Gravida  1   Para  1   Term      Preterm  1   AB      Living  1      SAB      IAB      Ectopic      Multiple  0   Live Births  1             Home Medications    Prior to Admission medications   Medication Sig Start Date End Date Taking? Authorizing Provider  ketoconazole (NIZORAL) 2 % cream Apply 1 Application topically daily. 07/01/23  Yes Leath-Warren, Sadie Haber, NP  ibuprofen (ADVIL,MOTRIN) 600 MG tablet Take 1 tablet (600 mg total) by mouth every 6 (six) hours. 11/20/17   Dale Corrales, FNP  Prenatal Vit-Fe Fumarate-FA (PRENATAL VITAMIN PO) Take 1 tablet by mouth daily.     [provider]    Family History Family History  Problem Relation Age of Onset   Hyperlipidemia Mother    Hypertension Mother    Hyperlipidemia Father    Hypertension Father     Social History Social History   Tobacco Use   Smoking status: Never   Smokeless tobacco: Never  Vaping Use   Vaping status: Never Used  Substance Use Topics   Alcohol use: No   Drug use: No     Allergies   Patient has no known allergies.   Review of Systems Review of Systems Per HPI  Physical Exam Triage Vital Signs  ED Triage Vitals  Encounter Vitals Group     BP 07/01/23 1853 109/72     Systolic BP Percentile --      Diastolic BP Percentile --      Pulse Rate 07/01/23 1853 84     Resp 07/01/23 1853 18     Temp 07/01/23 1853 98.5 F (36.9 C)     Temp Source 07/01/23 1853 Oral     SpO2 07/01/23 1853 99 %     Weight --      Height --      Head Circumference --      Peak Flow --      Pain Score 07/01/23 1854 0     Pain Loc --      Pain Education --      Exclude from Growth Chart --    No data found.  Updated Vital Signs BP 109/72 (BP Location: Right Arm)   Pulse 84   Temp 98.5 F (36.9 C) (Oral)   Resp 18   LMP 06/16/2023 (Exact Date)   SpO2 99%   Visual Acuity Right Eye Distance:   Left Eye Distance:   Bilateral Distance:    Right Eye Near:   Left Eye Near:    Bilateral Near:     Physical Exam Vitals and nursing note reviewed.  Constitutional:      General: She is not in acute distress.    Appearance:  Normal appearance.  HENT:     Head: Normocephalic.  Pulmonary:     Effort: Pulmonary effort is normal.  Musculoskeletal:     Cervical back: Normal range of motion.  Skin:    General: Skin is warm and dry.     Findings: Rash present.     Comments: Small patches of dry, hyperpigmented skin noted at the eyelids.  No obvious rash is present in the scalp.  Neurological:     General: No focal deficit present.     Mental Status: She is alert and oriented to person, place, and time.  Psychiatric:        Mood and Affect: Mood normal.        Behavior: Behavior normal.      UC Treatments / Results  Labs (all labs ordered are listed, but only abnormal results are displayed) Labs Reviewed - No data to display  EKG   Radiology No results found.  Procedures Procedures (including critical care time)  Medications Ordered in UC Medications - No data to display  Initial Impression / Assessment and Plan / UC Course  I have reviewed the triage vital signs and the nursing notes.  Pertinent labs & imaging results that were available during my care of the patient were reviewed by me and considered in my medical decision making (see chart for details).  Difficult to ascertain the cause of the patient's rash as symptoms are very mild at this time.  Will provide prescription for ketoconazole cream 2% to apply topically until patient can be seen by dermatology.  Supportive care recommendations were provided and discussed with the patient to include keeping the skin clean and dry, over-the-counter antihistamines, and avoiding scratching or manipulating the areas.  Patient advised to follow-up as needed.  Patient was in agreement with this plan of care and verbalizes understanding.  All questions were answered.  Patient stable for discharge.  Final Clinical Impressions(s) / UC Diagnoses   Final diagnoses:  Rash and nonspecific skin eruption     Discharge Instructions  Apply medication as  prescribed. May take over-the-counter Zyrtec to help with itching. Avoid hot baths or showers while symptoms persist.  Recommend taking lukewarm baths. May apply cool cloths to the area to help with itching or discomfort. Avoid scratching, rubbing, or manipulating the areas while symptoms persist. Follow-up with dermatology as scheduled. Follow-up as needed.    ED Prescriptions     Medication Sig Dispense Auth. Provider   ketoconazole (NIZORAL) 2 % cream Apply 1 Application topically daily. 60 g Leath-Warren, Sadie Haber, NP      PDMP not reviewed this encounter.   Abran Cantor, NP 07/01/23 2002

## 2023-09-09 ENCOUNTER — Other Ambulatory Visit: Payer: Self-pay | Admitting: Obstetrics and Gynecology

## 2023-09-25 ENCOUNTER — Other Ambulatory Visit: Payer: Self-pay | Admitting: Obstetrics and Gynecology

## 2023-09-25 ENCOUNTER — Encounter (HOSPITAL_COMMUNITY): Payer: Self-pay | Admitting: Obstetrics and Gynecology

## 2023-09-25 ENCOUNTER — Encounter (HOSPITAL_COMMUNITY): Payer: Self-pay

## 2023-09-26 ENCOUNTER — Encounter (HOSPITAL_COMMUNITY): Payer: Self-pay | Admitting: Obstetrics and Gynecology

## 2023-09-26 NOTE — Progress Notes (Signed)
 Spoke w/ via phone for pre-op interview--- pt Lab needs dos----   cbc, t&s, urine preg      Lab results------ no COVID test -----patient states asymptomatic no test needed Arrive at ------- 0915 NPO after MN  Pre-Surgery Ensure or G2: n/a  Med rec completed Medications to take morning of surgery ----- none Diabetic medication ----- n/a  GLP1 agonist last dose: n/a GLP1 instructions:  Patient instructed no nail polish to be worn day of surgery Patient instructed to bring photo id and insurance card day of surgery Patient aware to have Driver (ride ) / caregiver    for 24 hours after surgery - husband, Brandi Atkinson Patient Special Instructions ----- n/a Pre-Op special Instructions ----- pre-op orders need second sign  Patient verbalized understanding of instructions that were given at this phone interview. Patient denies chest pain, sob, fever, cough at the interview.

## 2023-09-29 ENCOUNTER — Inpatient Hospital Stay (HOSPITAL_COMMUNITY): Admission: RE | Admit: 2023-09-29 | Source: Ambulatory Visit

## 2023-10-01 NOTE — Progress Notes (Signed)
 Patient aware of time change and will arrive at 0715.

## 2023-10-02 ENCOUNTER — Encounter (HOSPITAL_COMMUNITY)
Admission: RE | Disposition: A | Discharge status: Home / Self Care | Payer: Self-pay | Source: Home / Self Care | Attending: Obstetrics and Gynecology

## 2023-10-02 ENCOUNTER — Ambulatory Visit (HOSPITAL_COMMUNITY): Payer: PRIVATE HEALTH INSURANCE | Admitting: Anesthesiology

## 2023-10-02 ENCOUNTER — Other Ambulatory Visit: Payer: Self-pay

## 2023-10-02 ENCOUNTER — Ambulatory Visit (HOSPITAL_BASED_OUTPATIENT_CLINIC_OR_DEPARTMENT_OTHER): Payer: PRIVATE HEALTH INSURANCE | Admitting: Anesthesiology

## 2023-10-02 ENCOUNTER — Encounter (HOSPITAL_COMMUNITY): Payer: Self-pay | Admitting: Obstetrics and Gynecology

## 2023-10-02 ENCOUNTER — Ambulatory Visit (HOSPITAL_COMMUNITY)
Admission: RE | Admit: 2023-10-02 | Discharge: 2023-10-02 | Disposition: A | Discharge status: Home / Self Care | Payer: PRIVATE HEALTH INSURANCE | Attending: Obstetrics and Gynecology | Admitting: Obstetrics and Gynecology

## 2023-10-02 DIAGNOSIS — N80121 Deep endometriosis of right ovary: Secondary | ICD-10-CM | POA: Diagnosis not present

## 2023-10-02 DIAGNOSIS — N939 Abnormal uterine and vaginal bleeding, unspecified: Secondary | ICD-10-CM | POA: Diagnosis present

## 2023-10-02 DIAGNOSIS — N83201 Unspecified ovarian cyst, right side: Secondary | ICD-10-CM | POA: Insufficient documentation

## 2023-10-02 DIAGNOSIS — I341 Nonrheumatic mitral (valve) prolapse: Secondary | ICD-10-CM | POA: Insufficient documentation

## 2023-10-02 DIAGNOSIS — D573 Sickle-cell trait: Secondary | ICD-10-CM | POA: Diagnosis not present

## 2023-10-02 DIAGNOSIS — Z01818 Encounter for other preprocedural examination: Secondary | ICD-10-CM

## 2023-10-02 HISTORY — PX: CHROMOPERTUBATION: SHX6288

## 2023-10-02 HISTORY — DX: Cardiac murmur, unspecified: R01.1

## 2023-10-02 HISTORY — DX: Personal history of other diseases of the female genital tract: Z87.42

## 2023-10-02 HISTORY — DX: Personal history of diseases of the blood and blood-forming organs and certain disorders involving the immune mechanism: Z86.2

## 2023-10-02 HISTORY — DX: Leiomyoma of uterus, unspecified: D25.9

## 2023-10-02 HISTORY — PX: DILATATION & CURETTAGE/HYSTEROSCOPY WITH MYOSURE: SHX6511

## 2023-10-02 HISTORY — DX: Endometriosis, unspecified: N80.9

## 2023-10-02 HISTORY — DX: Female infertility, unspecified: N97.9

## 2023-10-02 LAB — POCT PREGNANCY, URINE: Preg Test, Ur: NEGATIVE

## 2023-10-02 LAB — CBC
HCT: 35.8 % — ABNORMAL LOW (ref 36.0–46.0)
Hemoglobin: 12 g/dL (ref 12.0–15.0)
MCH: 28.9 pg (ref 26.0–34.0)
MCHC: 33.5 g/dL (ref 30.0–36.0)
MCV: 86.3 fL (ref 80.0–100.0)
Platelets: 385 10*3/uL (ref 150–400)
RBC: 4.15 MIL/uL (ref 3.87–5.11)
RDW: 13.5 % (ref 11.5–15.5)
WBC: 4.8 10*3/uL (ref 4.0–10.5)
nRBC: 0 % (ref 0.0–0.2)

## 2023-10-02 LAB — TYPE AND SCREEN
ABO/RH(D): O POS
Antibody Screen: NEGATIVE

## 2023-10-02 SURGERY — EXCISION, ENDOMETRIOSIS, LAPAROSCOPIC
Anesthesia: General | Site: Vagina | Laterality: Right

## 2023-10-02 MED ORDER — PHENYLEPHRINE 80 MCG/ML (10ML) SYRINGE FOR IV PUSH (FOR BLOOD PRESSURE SUPPORT)
PREFILLED_SYRINGE | INTRAVENOUS | Status: DC | PRN
  Administered 2023-10-02: 40 ug via INTRAVENOUS
  Administered 2023-10-02: 80 ug via INTRAVENOUS
  Administered 2023-10-02: 160 ug via INTRAVENOUS
  Administered 2023-10-02: 240 ug via INTRAVENOUS
  Administered 2023-10-02 (×2): 160 ug via INTRAVENOUS

## 2023-10-02 MED ORDER — SCOPOLAMINE 1 MG/3DAYS TD PT72
1.0000 | MEDICATED_PATCH | TRANSDERMAL | Status: DC
  Administered 2023-10-02: 1.5 mg via TRANSDERMAL

## 2023-10-02 MED ORDER — SUGAMMADEX SODIUM 200 MG/2ML IV SOLN
INTRAVENOUS | Status: DC | PRN
  Administered 2023-10-02: 200 mg via INTRAVENOUS

## 2023-10-02 MED ORDER — ORAL CARE MOUTH RINSE: 15.0000 mL | Freq: Once | OROMUCOSAL | Status: AC

## 2023-10-02 MED ORDER — ONDANSETRON HCL 4 MG/2ML IJ SOLN
INTRAMUSCULAR | Status: DC | PRN
  Administered 2023-10-02: 4 mg via INTRAVENOUS

## 2023-10-02 MED ORDER — SODIUM CHLORIDE (PF) 0.9 % IJ SOLN
INTRAMUSCULAR | Status: AC
  Filled 2023-10-02: qty 10

## 2023-10-02 MED ORDER — KETOROLAC TROMETHAMINE 30 MG/ML IJ SOLN
INTRAMUSCULAR | Status: AC
  Filled 2023-10-02: qty 1

## 2023-10-02 MED ORDER — BUPIVACAINE HCL (PF) 0.25 % IJ SOLN
INTRAMUSCULAR | Status: DC | PRN
  Administered 2023-10-02: 11 mL

## 2023-10-02 MED ORDER — SODIUM CHLORIDE 0.9 % IR SOLN
Status: DC | PRN
  Administered 2023-10-02: 1000 mL
  Administered 2023-10-02: 3000 mL
  Administered 2023-10-02 (×3): 1000 mL

## 2023-10-02 MED ORDER — ONDANSETRON HCL 4 MG/2ML IJ SOLN
INTRAMUSCULAR | Status: AC
  Filled 2023-10-02: qty 2

## 2023-10-02 MED ORDER — SODIUM CHLORIDE 0.9 % IV SOLN: 12.5000 mg | INTRAVENOUS | Status: DC | PRN

## 2023-10-02 MED ORDER — MIDAZOLAM HCL 5 MG/5ML IJ SOLN
INTRAMUSCULAR | Status: DC | PRN
  Administered 2023-10-02: 2 mg via INTRAVENOUS

## 2023-10-02 MED ORDER — PROPOFOL 10 MG/ML IV BOLUS
INTRAVENOUS | Status: DC | PRN
  Administered 2023-10-02: 140 mg via INTRAVENOUS

## 2023-10-02 MED ORDER — PHENYLEPHRINE 80 MCG/ML (10ML) SYRINGE FOR IV PUSH (FOR BLOOD PRESSURE SUPPORT)
PREFILLED_SYRINGE | INTRAVENOUS | Status: AC
  Filled 2023-10-02: qty 10

## 2023-10-02 MED ORDER — SUGAMMADEX SODIUM 200 MG/2ML IV SOLN
INTRAVENOUS | Status: AC
  Filled 2023-10-02: qty 2

## 2023-10-02 MED ORDER — LACTATED RINGERS IV SOLN: INTRAVENOUS | Status: DC

## 2023-10-02 MED ORDER — CHLORHEXIDINE GLUCONATE 0.12 % MT SOLN
15.0000 mL | Freq: Once | OROMUCOSAL | Status: AC
  Administered 2023-10-02: 15 mL via OROMUCOSAL

## 2023-10-02 MED ORDER — OXYCODONE HCL 5 MG PO TABS
5.0000 mg | ORAL_TABLET | Freq: Once | ORAL | Status: AC | PRN
  Administered 2023-10-02: 5 mg via ORAL

## 2023-10-02 MED ORDER — FENTANYL CITRATE (PF) 100 MCG/2ML IJ SOLN
INTRAMUSCULAR | Status: DC | PRN
  Administered 2023-10-02: 100 ug via INTRAVENOUS
  Administered 2023-10-02: 25 ug via INTRAVENOUS

## 2023-10-02 MED ORDER — SCOPOLAMINE 1 MG/3DAYS TD PT72
MEDICATED_PATCH | TRANSDERMAL | Status: AC
  Filled 2023-10-02: qty 1

## 2023-10-02 MED ORDER — ACETAMINOPHEN 500 MG PO TABS: 1000.0000 mg | ORAL_TABLET | Freq: Once | ORAL | Status: DC

## 2023-10-02 MED ORDER — MIDAZOLAM HCL 2 MG/2ML IJ SOLN
INTRAMUSCULAR | Status: AC
  Filled 2023-10-02: qty 2

## 2023-10-02 MED ORDER — ROCURONIUM BROMIDE 10 MG/ML (PF) SYRINGE
PREFILLED_SYRINGE | INTRAVENOUS | Status: DC | PRN
Start: 2023-10-02 — End: 2023-10-02
  Administered 2023-10-02 (×2): 20 mg via INTRAVENOUS
  Administered 2023-10-02: 60 mg via INTRAVENOUS
  Administered 2023-10-02: 10 mg via INTRAVENOUS

## 2023-10-02 MED ORDER — POVIDONE-IODINE 10 % EX SWAB
2.0000 | Freq: Once | CUTANEOUS | Status: DC
Start: 2023-10-02 — End: 2023-10-02

## 2023-10-02 MED ORDER — CELECOXIB 200 MG PO CAPS
ORAL_CAPSULE | ORAL | Status: AC
  Filled 2023-10-02: qty 1

## 2023-10-02 MED ORDER — CEFAZOLIN SODIUM-DEXTROSE 2-3 GM-%(50ML) IV SOLR
INTRAVENOUS | Status: DC | PRN
  Administered 2023-10-02: 2 g via INTRAVENOUS

## 2023-10-02 MED ORDER — LIDOCAINE 2% (20 MG/ML) 5 ML SYRINGE
INTRAMUSCULAR | Status: DC | PRN
  Administered 2023-10-02: 80 mg via INTRAVENOUS

## 2023-10-02 MED ORDER — CEFAZOLIN SODIUM 1 G IJ SOLR
INTRAMUSCULAR | Status: AC
Start: 2023-10-02 — End: ?
  Filled 2023-10-02: qty 20

## 2023-10-02 MED ORDER — HEMOSTATIC AGENTS (NO CHARGE) OPTIME
TOPICAL | Status: DC | PRN
  Administered 2023-10-02: 1 via TOPICAL

## 2023-10-02 MED ORDER — CHLORHEXIDINE GLUCONATE 0.12 % MT SOLN
OROMUCOSAL | Status: AC
  Filled 2023-10-02: qty 15

## 2023-10-02 MED ORDER — DEXMEDETOMIDINE HCL IN NACL 80 MCG/20ML IV SOLN
INTRAVENOUS | Status: DC | PRN
  Administered 2023-10-02: 4 ug via INTRAVENOUS
  Administered 2023-10-02: 8 ug via INTRAVENOUS

## 2023-10-02 MED ORDER — DEXAMETHASONE SODIUM PHOSPHATE 10 MG/ML IJ SOLN
INTRAMUSCULAR | Status: DC | PRN
  Administered 2023-10-02: 10 mg via INTRAVENOUS

## 2023-10-02 MED ORDER — METHYLENE BLUE (ANTIDOTE) 1 % IV SOLN
INTRAVENOUS | Status: DC | PRN
  Administered 2023-10-02: 5 mL via SUBMUCOSAL

## 2023-10-02 MED ORDER — CELECOXIB 200 MG PO CAPS
200.0000 mg | ORAL_CAPSULE | Freq: Once | ORAL | Status: AC
  Administered 2023-10-02: 200 mg via ORAL

## 2023-10-02 MED ORDER — LIDOCAINE HCL (PF) 1 % IJ SOLN
INTRAMUSCULAR | Status: AC
  Filled 2023-10-02: qty 30

## 2023-10-02 MED ORDER — FENTANYL CITRATE (PF) 100 MCG/2ML IJ SOLN: 25.0000 ug | INTRAMUSCULAR | Status: DC | PRN

## 2023-10-02 MED ORDER — AMISULPRIDE (ANTIEMETIC) 5 MG/2ML IV SOLN: 10.0000 mg | Freq: Once | INTRAVENOUS | Status: DC | PRN

## 2023-10-02 MED ORDER — ACETAMINOPHEN 500 MG PO TABS
1000.0000 mg | ORAL_TABLET | Freq: Once | ORAL | Status: AC
  Administered 2023-10-02: 1000 mg via ORAL

## 2023-10-02 MED ORDER — IBUPROFEN 600 MG PO TABS: 600.0000 mg | ORAL_TABLET | Freq: Four times a day (QID) | ORAL | 1 refills | Status: AC | PRN

## 2023-10-02 MED ORDER — KETOROLAC TROMETHAMINE 30 MG/ML IJ SOLN
INTRAMUSCULAR | Status: DC | PRN
  Administered 2023-10-02: 30 mg via INTRAVENOUS

## 2023-10-02 MED ORDER — OXYCODONE HCL 5 MG PO TABS: 5.0000 mg | ORAL_TABLET | Freq: Four times a day (QID) | ORAL | Status: DC | PRN

## 2023-10-02 MED ORDER — PHENYLEPHRINE HCL-NACL 20-0.9 MG/250ML-% IV SOLN
INTRAVENOUS | Status: DC | PRN
  Administered 2023-10-02: 20 ug/min via INTRAVENOUS

## 2023-10-02 MED ORDER — BUPIVACAINE HCL (PF) 0.25 % IJ SOLN
INTRAMUSCULAR | Status: AC
  Filled 2023-10-02: qty 30

## 2023-10-02 MED ORDER — FENTANYL CITRATE (PF) 250 MCG/5ML IJ SOLN
INTRAMUSCULAR | Status: AC
  Filled 2023-10-02: qty 5

## 2023-10-02 MED ORDER — DEXAMETHASONE SODIUM PHOSPHATE 10 MG/ML IJ SOLN
INTRAMUSCULAR | Status: AC
  Filled 2023-10-02: qty 1

## 2023-10-02 MED ORDER — PROPOFOL 10 MG/ML IV BOLUS
INTRAVENOUS | Status: AC
  Filled 2023-10-02: qty 20

## 2023-10-02 MED ORDER — OXYCODONE HCL 5 MG PO TABS: 5.0000 mg | ORAL_TABLET | Freq: Four times a day (QID) | ORAL | 0 refills | Status: DC | PRN

## 2023-10-02 MED ORDER — LIDOCAINE 2% (20 MG/ML) 5 ML SYRINGE
INTRAMUSCULAR | Status: AC
  Filled 2023-10-02: qty 5

## 2023-10-02 MED ORDER — ROCURONIUM BROMIDE 10 MG/ML (PF) SYRINGE
PREFILLED_SYRINGE | INTRAVENOUS | Status: AC
  Filled 2023-10-02: qty 10

## 2023-10-02 MED ORDER — OXYCODONE HCL 5 MG/5ML PO SOLN: 5.0000 mg | Freq: Once | ORAL | Status: AC | PRN

## 2023-10-02 MED ORDER — OXYCODONE HCL 5 MG PO TABS
ORAL_TABLET | ORAL | Status: AC
  Filled 2023-10-02: qty 1

## 2023-10-02 MED ORDER — ACETAMINOPHEN 500 MG PO TABS
ORAL_TABLET | ORAL | Status: AC
  Filled 2023-10-02: qty 1

## 2023-10-02 SURGICAL SUPPLY — 56 items
APPLICATOR ARISTA FLEXITIP XL (MISCELLANEOUS) ×2 IMPLANT
BAG COUNTER SPONGE SURGICOUNT (BAG) ×5 IMPLANT
BLADE SURG 10 STRL SS (BLADE) ×8 IMPLANT
CNTNR URN SCR LID CUP LEK RST (MISCELLANEOUS) ×2 IMPLANT
COVER SURGICAL LIGHT HANDLE (MISCELLANEOUS) ×2 IMPLANT
DERMABOND ADVANCED .7 DNX12 (GAUZE/BANDAGES/DRESSINGS) ×5 IMPLANT
DERMABOND ADVANCED .7 DNX6 (GAUZE/BANDAGES/DRESSINGS) ×2 IMPLANT
DRAPE SURG IRRIG POUCH 19X23 (DRAPES) ×5 IMPLANT
DRSG OPSITE POSTOP 3X4 (GAUZE/BANDAGES/DRESSINGS) IMPLANT
DURAPREP 26ML APPLICATOR (WOUND CARE) ×10 IMPLANT
GAUZE 4X4 16PLY ~~LOC~~+RFID DBL (SPONGE) IMPLANT
GLOVE BIO SURGEON STRL SZ 6.5 (GLOVE) ×2 IMPLANT
GLOVE BIO SURGEON STRL SZ7.5 (GLOVE) ×5 IMPLANT
GLOVE BIOGEL PI IND STRL 7.0 (GLOVE) ×16 IMPLANT
GLOVE BIOGEL PI IND STRL 7.5 (GLOVE) ×9 IMPLANT
GLOVE SURG ENC MOIS LTX SZ7.5 (GLOVE) ×5 IMPLANT
GLOVE SURG SS PI 7.5 STRL IVOR (GLOVE) ×2 IMPLANT
GLOVE SURG SYN 6.5 ES PF (GLOVE) ×5 IMPLANT
GLOVE SURG SYN 6.5 PF PI (GLOVE) IMPLANT
GLOVE SURG UNDER LTX SZ7.5 (GLOVE) ×10 IMPLANT
GLOVE SURG UNDER POLY LF SZ7 (GLOVE) ×5 IMPLANT
GOWN STRL REUS W/ TWL LRG LVL3 (GOWN DISPOSABLE) ×12 IMPLANT
GOWN STRL REUS W/ TWL XL LVL3 (GOWN DISPOSABLE) ×4 IMPLANT
GOWN STRL SURGICAL XL XLNG (GOWN DISPOSABLE) ×2 IMPLANT
HEMOSTAT ARISTA ABSORB 3G PWDR (HEMOSTASIS) ×2 IMPLANT
IRRIG SUCT STRYKERFLOW 2 WTIP (MISCELLANEOUS) ×5 IMPLANT
IRRIGATION SUCT STRKRFLW 2 WTP (MISCELLANEOUS) IMPLANT
IV NS 1000ML BAXH (IV SOLUTION) ×8 IMPLANT
KIT PINK PAD W/HEAD ARE REST (MISCELLANEOUS) ×5 IMPLANT
KIT PINK PAD W/HEAD ARM REST (MISCELLANEOUS) ×3 IMPLANT
KIT PROCEDURE FLUENT (KITS) ×5 IMPLANT
KIT TURNOVER KIT B (KITS) ×5 IMPLANT
MANIFOLD NEPTUNE II (INSTRUMENTS) ×2 IMPLANT
NDL HYPO 22X1.5 SAFETY MO (MISCELLANEOUS) IMPLANT
NDL INSUFFLATION 14GA 120MM (NEEDLE) ×3 IMPLANT
NEEDLE HYPO 22X1.5 SAFETY MO (MISCELLANEOUS) ×5 IMPLANT
NEEDLE INSUFFLATION 14GA 120MM (NEEDLE) ×5 IMPLANT
NS IRRIG 1000ML POUR BTL (IV SOLUTION) ×5 IMPLANT
PACK LAPAROSCOPY BASIN (CUSTOM PROCEDURE TRAY) ×5 IMPLANT
PACK VAGINAL MINOR WOMEN LF (CUSTOM PROCEDURE TRAY) ×5 IMPLANT
PAD OB MATERNITY 11 LF (PERSONAL CARE ITEMS) ×5 IMPLANT
SEAL ROD LENS SCOPE MYOSURE (ABLATOR) ×5 IMPLANT
SET TUBE SMOKE EVAC HIGH FLOW (TUBING) ×5 IMPLANT
SLEEVE ADV FIXATION 5X100MM (TROCAR) ×7 IMPLANT
SOL .9 NS 3000ML IRR UROMATIC (IV SOLUTION) ×2 IMPLANT
SPIKE FLUID TRANSFER (MISCELLANEOUS) ×6 IMPLANT
SUT MNCRL AB 3-0 PS2 27 (SUTURE) ×10 IMPLANT
SYR 30ML LL (SYRINGE) ×4 IMPLANT
SYR 50ML LL SCALE MARK (SYRINGE) IMPLANT
SYR CONTROL 10ML LL (SYRINGE) ×2 IMPLANT
TOWEL GREEN STERILE FF (TOWEL DISPOSABLE) ×8 IMPLANT
TRAY FOLEY W/BAG SLVR 14FR (SET/KITS/TRAYS/PACK) ×5 IMPLANT
TROCAR ADV FIXATION 11X100MM (TROCAR) ×5 IMPLANT
TROCAR ADV FIXATION 5X100MM (TROCAR) ×5 IMPLANT
UNDERPAD 30X36 HEAVY ABSORB (UNDERPADS AND DIAPERS) ×5 IMPLANT
WARMER LAPAROSCOPE (MISCELLANEOUS) ×5 IMPLANT

## 2023-10-02 NOTE — Discharge Instructions (Signed)
  Post Anesthesia Home Care Instructions  Activity: Get plenty of rest for the remainder of the day. A responsible individual must stay with you for 24 hours following the procedure.  For the next 24 hours, DO NOT: -Drive a car -Advertising copywriter -Drink alcoholic beverages -Take any medication unless instructed by your physician -Make any legal decisions or sign important papers.  Meals: Start with liquid foods such as gelatin or soup. Progress to regular foods as tolerated. Avoid greasy, spicy, heavy foods. If nausea and/or vomiting occur, drink only clear liquids until the nausea and/or vomiting subsides. Call your physician if vomiting continues.  Special Instructions/Symptoms: Your throat may feel dry or sore from the anesthesia or the breathing tube placed in your throat during surgery. If this causes discomfort, gargle with warm salt water. The discomfort should disappear within 24 hours.  If you had a scopolamine patch placed behind your ear for the management of post- operative nausea and/or vomiting:  1. The medication in the patch is effective for 72 hours, after which it should be removed.  Wrap patch in a tissue and discard in the trash. Wash hands thoroughly with soap and water. 2. You may remove the patch earlier than 72 hours if you experience unpleasant side effects which may include dry mouth, dizziness or visual disturbances. 3. Avoid touching the patch. Wash your hands with soap and water after contact with the patch.    No ibuprofen, Advil, Aleve, Motrin, ketorolac, meloxicam, naproxen, or other NSAIDS until after 6:45 pm today if needed. No acetaminophen/Tylenol until after 2 pm today if needed.

## 2023-10-02 NOTE — Anesthesia Postprocedure Evaluation (Signed)
 Anesthesia Post Note  Patient: Brandi Atkinson  Procedure(s) Performed: EXCISION, ENDOMETRIOSIS, LAPAROSCOPIC AND CYSTECTOMY (Right: Abdomen) HYSTEROSCOPY with DIALATION AND CURETTAGE (Right: Vagina ) CHROMOPERTUBATION, FALLOPIAN TUBE (Abdomen)     Patient location during evaluation: PACU Anesthesia Type: General Level of consciousness: awake and alert Pain management: pain level controlled Vital Signs Assessment: post-procedure vital signs reviewed and stable Respiratory status: spontaneous breathing, nonlabored ventilation and respiratory function stable Cardiovascular status: stable and blood pressure returned to baseline Anesthetic complications: no   No notable events documented.  Last Vitals:  Vitals:   10/02/23 1400 10/02/23 1430  BP: 110/76 112/71  Pulse: 86 84  Resp: 19 16  Temp: 36.4 C 36.4 C  SpO2: 100% 100%    Last Pain:  Vitals:   10/02/23 1430  TempSrc:   PainSc: 4    Pain Goal: Patients Stated Pain Goal: 3 (10/02/23 1420)                 Beryle Lathe

## 2023-10-02 NOTE — Anesthesia Preprocedure Evaluation (Addendum)
 Anesthesia Evaluation  Patient identified by MRN, date of birth, ID band Patient awake    Reviewed: Allergy & Precautions, NPO status , Patient's Chart, lab work & pertinent test results  History of Anesthesia Complications Negative for: history of anesthetic complications  Airway Mallampati: II  TM Distance: >3 FB     Dental no notable dental hx. (+) Dental Advisory Given, Teeth Intact   Pulmonary neg pulmonary ROS   Pulmonary exam normal breath sounds clear to auscultation       Cardiovascular + Valvular Problems/Murmurs MVP  Rhythm:Regular Rate:Normal + Systolic Click    Neuro/Psych negative neurological ROS  negative psych ROS   GI/Hepatic negative GI ROS, Neg liver ROS,,,  Endo/Other  negative endocrine ROS    Renal/GU negative Renal ROS  negative genitourinary   Musculoskeletal negative musculoskeletal ROS (+)    Abdominal   Peds  Hematology  (+) Blood dyscrasia, Sickle cell trait   Anesthesia Other Findings   Reproductive/Obstetrics AUB                              Anesthesia Physical Anesthesia Plan  ASA: 2  Anesthesia Plan: General   Post-op Pain Management: Tylenol PO (pre-op)* and Celebrex PO (pre-op)*   Induction: Intravenous  PONV Risk Score and Plan: 3 and Treatment may vary due to age or medical condition, Ondansetron, Dexamethasone, Midazolam and Scopolamine patch - Pre-op  Airway Management Planned: Oral ETT  Additional Equipment: None  Intra-op Plan:   Post-operative Plan: Extubation in OR  Informed Consent: I have reviewed the patients History and Physical, chart, labs and discussed the procedure including the risks, benefits and alternatives for the proposed anesthesia with the patient or authorized representative who has indicated his/her understanding and acceptance.     Dental advisory given  Plan Discussed with: CRNA and  Anesthesiologist  Anesthesia Plan Comments:          Anesthesia Quick Evaluation

## 2023-10-02 NOTE — Transfer of Care (Signed)
 Immediate Anesthesia Transfer of Care Note  Patient: Brandi Atkinson  Procedure(s) Performed: EXCISION, ENDOMETRIOSIS, LAPAROSCOPIC (Right: Abdomen) HYSTEROSCOPY with DIALATION AND CURETTAGE (Right: Vagina ) CHROMOPERTUBATION, FALLOPIAN TUBE (Abdomen)  Patient Location: PACU  Anesthesia Type:General  Level of Consciousness: drowsy and patient cooperative  Airway & Oxygen Therapy: Patient Spontanous Breathing and Patient connected to face mask oxygen  Post-op Assessment: Report given to RN and Post -op Vital signs reviewed and stable  Post vital signs: Reviewed and stable  Last Vitals:  Vitals Value Taken Time  BP 124/86 10/02/23 1306  Temp    Pulse 97 10/02/23 1312  Resp 20 10/02/23 1312  SpO2 100 % 10/02/23 1312  Vitals shown include unfiled device data.  Last Pain:  Vitals:   10/02/23 0756  TempSrc: Oral  PainSc: 0-No pain      Patients Stated Pain Goal: 3 (10/02/23 0756)  Complications: No notable events documented.

## 2023-10-02 NOTE — Op Note (Signed)
 Preop Diagnosis: 1.Pelvic Mass 2.Abnormal Uterine Bleeding   Postop Diagnosis: 1.Pelvic Mass 2.Abnormal Uterine Bleeding    Procedure: LAPAROSCOPIC EXCISION OF ENDOMETRIOSIS DILATATION & CURETTAGE/HYSTEROSCOPY WITH MYOSURE EXPLORATORY LAPAROTOMY CHROMOPERTUBATION DILATATION AND CURETTAGE /HYSTEROSCOPY   Anesthesia: General   Anesthesiologist: Beryle Lathe, MD   Attending: Osborn Coho, MD   Assistant:  Jaymes Graff, MD Garen Lah, RNFA  Findings: Large rt pelvic side wall mass/ovarian mass c/w appearance of endometioma with chocolate colored fluid aspirated and portion of cyst wall sent to pathology.  Inadvertent entry into rt pelvic side wall.  No uterine intracavitary lesions.  Left fallopian tube and ovary appeared to be wnl.  There was free spill from the left fallopian tube c/w patency.  Pathology: 1.endometioma cyst fluid 2.portion of endometrioma cyst wall 3.endometriotic implants 4.endometrial curettings  Fluids: 1500 cc  UOP: 50 cc  EBL: 50 cc  Complications: None  Procedure: The patient was taken to the operating room after the risks, benefits, alternatives, complications, treatment options, and expected outcomes were discussed with the patient. The patient verbalized understanding, the patient concurred with the proposed plan and consent signed and witnessed. The patient was taken to the Operating Room and identified as Brandi Atkinson and the procedure verified as laparoscopic cystectomy, chromopertubation and hysteroscopy D&C.  The patient was placed under general anesthesia per anesthesia staff, the patient was placed in modified dorsal lithotomy position and was prepped, draped, and catheterized in the normal, sterile fashion.  A Time Out was held and the above information confirmed.  The cervix was visualized and an acorn intrauterine manipulator was placed.  A 10 mm umbilical incision was then performed. Veress needle was passed and  pneumoperitoneum was established. A 10 mm trocar was advanced into the intraabdominal cavity, the laparoscope was introduced and findings as noted above.  Patient was placed in trendelenburg and marcaine injected in the RLQ and a 5 mm incision was made and 5 mm trocar advanced into the intraabdominal cavity.  The same was done in the LLQ.  The left ovary was identified and left fallopian tube noted and appeared wnl.  Chromopertubation performed and free spill from the left tube noted.  Many adhesions from the bowel to the uterus and right adnexa noted and appendix adherent to uterine fundus.  Hydrodissection performed on filmy adhesions.  Ultimately right pelvic side wall was noted to be entered.  LLQ trocar was placed in a similar fashion as the RLQ trocar.  Dr. Normand Sloop was asked to scrub in and ovarian mass noted and entered and chocolate fluid inside.  It was aspirated and sent to pathology/cytology.  Portion of the cyst wall was removed and d/t adhesions and the majority of the mass noted in the side wall entangled with ureter and right illiac, decision made to just make sure there was good hemostasis after copious irrigation.  Arista was placed and good hemostasis assured.  Right and left trocars were removed as well under direct visualization.  Pneumoperitoneum was relieved and umbilical trocar removed under direct visualization.  The umbilical fascia was repaired with 0 vicryl.  The 10 mm skin incision was repaired with 3-0 monocryl via a subcuticular stitch and dermabond was applied to all three skin incisions.    Attention was turned to the perineum.  The uterus sounded to 9 cm. The cervix was already dilated for passage of the hysteroscope.  The hysteroscope was introduced into the uterine cavity and findings as noted above. Sharp curettage was performed until a gritty texture  was noted and curettings were sent to pathology. The hysteroscope was reintroduced and no obvious remaining intracavitary  lesions were noted.  All instruments were removed.   Sponge, instrument, lap and needle counts were correct.  The patient tolerated the procedure well and was awaiting extubation and transfer to the recovery room.  An assistant was needed due to the complexity of anatomy.

## 2023-10-02 NOTE — H&P (Signed)
 Brandi Atkinson is an 38 y.o. female. Pt known to me presenting for scheduled surgery.  Pertinent Gynecological History: Menses:  regular but recently frequent q2wks Bleeding: dysfunctional uterine bleeding Previous GYN Procedures:  cystectomy x 2   Last mammogram:  dense breasts  Date: 06/26/23 Last pap: normal Date: 11-13-22 OB History: G1, P0101  Genetic testing increase breast cancer risk Menstrual History:  Patient's last menstrual period was 09/19/2023 (exact date).    Past Medical History:  Diagnosis Date   Endometriosis    Heart murmur    History of cervical incompetence    History of thrombocytosis    Infertility, female    Mitral valve prolapse    per pt told at age 89 had cardiology evaluation , had echo , showed MVP,  no follow up   Sickle cell trait [facility])    Uterine fibroid     Past Surgical History:  Procedure Laterality Date   BREAST REDUCTION SURGERY Bilateral 08/17/2020   @[facility]   by dr Reece AgarHart [place]   CERVICAL CERCLAGE Bilateral 08/29/2017   Procedure: CERCLAGE CERVICAL;  Surgeon: Osborn Coho, MD;  Location: [facility];  Service: Gynecology;  Laterality: Bilateral;   LAPAROSCOPIC ABDOMINAL EXPLORATION Bilateral 07/02/2013   @[facility]  by dr Bea Laura. skinner;   LAPAROSCOPIC LEFT OVARIAN CYSTECTOMY CONVERSION EXPLORATORY LAPAROTOMY RESECTION RIGHT OVARIAN CYSTECTOMY   WISDOM TOOTH EXTRACTION  2011    Family History  Problem Relation Age of Onset   Hyperlipidemia Mother    Hypertension Mother    Hyperlipidemia Father    Hypertension Father     Social History:  reports that she has never smoked. She has never used smokeless tobacco. She reports that she does not drink alcohol and does not use drugs.  Allergies: No Known Allergies  Medications Prior to Admission  Medication Sig Dispense Refill Last Dose/Taking   ibuprofen (ADVIL) 200 MG tablet Take 400 mg by mouth every 6 (six) hours as needed.   Past Week   ketoconazole (NIZORAL) 2 % cream Apply 1 Application  topically daily. 60 g 0 Past Week   Multiple Vitamin (MULTIVITAMIN WITH MINERALS) TABS tablet Take 1 tablet by mouth at bedtime.   Past Month    Review of Systems Denies F/C/N/V/D  Blood pressure 118/83, pulse 69, temperature 98.3 F (36.8 C), temperature source Oral, resp. rate 16, height 5\' 4"  (1.626 m), weight 60.8 kg, last menstrual period 09/19/2023, SpO2 99%. Physical Exam  Lungs CTA CV RRR Abdomen soft, NT Extremities no calf tenderness  Results for orders placed or performed during the hospital encounter of 10/02/23 (from the past 24 hours)  Pregnancy, urine POC     Status: None   Collection Time: 10/02/23  7:37 AM  Result Value Ref Range   Preg Test, Ur NEGATIVE NEGATIVE  Type and screen [facility]     Status: None (Preliminary result)   Collection Time: 10/02/23  8:12 AM  Result Value Ref Range   ABO/RH(D) PENDING    Antibody Screen NEG    Sample Expiration      10/05/2023,2359 Performed at [facility], [address] [place], [place] [address]     No results found.  Assessment/Plan: 37yo G1P0101 with known h/o endometriosis now with 6cm right ovarian mass probable endometrioma and abnormal and frequent bleeding presenting today for scheduled laparoscopic cystectomy, possible laparotomy, chromopertubation, hysteroscopy, D&C, possible resection.  Risks benefits and alternatives reviewed including but not limited to bleeding infection and injury, questions answered and consent signed and  witnessed.   Purcell Nails 10/02/2023, 9:42 AM

## 2023-10-02 NOTE — Anesthesia Procedure Notes (Signed)
 Procedure Name: Intubation Date/Time: 10/02/2023 10:26 AM  Performed by: Bishop Limbo, CRNAPre-anesthesia Checklist: Patient identified, Emergency Drugs available, Suction available and Patient being monitored Patient Re-evaluated:Patient Re-evaluated prior to induction Oxygen Delivery Method: Circle System Utilized Preoxygenation: Pre-oxygenation with 100% oxygen Induction Type: IV induction Ventilation: Mask ventilation without difficulty Laryngoscope Size: Mac and 3 Grade View: Grade I Tube type: Oral Tube size: 7.0 mm Number of attempts: 1 Airway Equipment and Method: Stylet Placement Confirmation: ETT inserted through vocal cords under direct vision, positive ETCO2 and breath sounds checked- equal and bilateral Secured at: 22 cm Tube secured with: Tape Dental Injury: Teeth and Oropharynx as per pre-operative assessment

## 2023-10-03 ENCOUNTER — Encounter (HOSPITAL_COMMUNITY): Payer: Self-pay | Admitting: Obstetrics and Gynecology

## 2023-10-03 LAB — SURGICAL PATHOLOGY

## 2023-10-03 LAB — CYTOLOGY - NON PAP

## 2023-12-05 ENCOUNTER — Ambulatory Visit: Payer: Self-pay

## 2024-03-10 ENCOUNTER — Ambulatory Visit
Admission: RE | Admit: 2024-03-10 | Discharge: 2024-03-10 | Disposition: A | Discharge status: Home / Self Care | Payer: PRIVATE HEALTH INSURANCE | Attending: Family Medicine | Admitting: Family Medicine

## 2024-03-10 VITALS — BP 107/72 | HR 74 | Temp 99.2°F | Resp 18

## 2024-03-10 DIAGNOSIS — B350 Tinea barbae and tinea capitis: Secondary | ICD-10-CM

## 2024-03-10 MED ORDER — KETOCONAZOLE 2 % EX CREA: 1.0000 | TOPICAL_CREAM | Freq: Two times a day (BID) | CUTANEOUS | 1 refills | Status: AC | PRN

## 2024-03-10 NOTE — ED Triage Notes (Signed)
 States has an infection on her scalp x 1 week and is requesting ketoconazole 

## 2024-03-10 NOTE — ED Provider Notes (Signed)
 [facility]    CSN: [id number] Arrival date & time: 03/10/24  1758      History   Chief Complaint Chief Complaint  Patient presents with   Rash    Tinea capitis - Entered by patient    HPI Brandi Atkinson is a 38 y.o. female.   Patient presenting today with about a week of some mild itching to the upper for scalp.  States that she has had tinea capitis in the past diagnosed by dermatology and that it got severe about a year ago and she lost a lot of hair so she is wanting to quickly start medication before things get more significant.  She was given ketoconazole  cream previously for this with good resolution.  Denies new products use, new foods or medications, rashes elsewhere.    Past Medical History:  Diagnosis Date   Endometriosis    Heart murmur    History of cervical incompetence    History of thrombocytosis    Infertility, female    Mitral valve prolapse    per pt told at age 36 had cardiology evaluation , had echo , showed MVP,  no follow up   Sickle cell trait [facility])    Uterine fibroid     Patient Active Problem List   Diagnosis Date Noted   [redacted] weeks gestation of pregnancy 11/18/2017   Preterm premature rupture of membranes (PPROM) with unknown onset of labor 11/17/2017   [redacted] weeks gestation of pregnancy    Encounter for fetal anatomic survey    Poor clinical fetal growth    [redacted] weeks gestation of pregnancy 08/29/2017   Short cervical length during pregnancy 08/29/2017    Past Surgical History:  Procedure Laterality Date   BREAST REDUCTION SURGERY Bilateral 08/17/2020   @[facility]   by dr gSABRA collum   CERVICAL CERCLAGE Bilateral 08/29/2017   Procedure: CERCLAGE CERVICAL;  Surgeon: Henry Slough, MD;  Location: [facility];  Service: Gynecology;  Laterality: Bilateral;   CHROMOPERTUBATION N/A 10/02/2023   Procedure: CHROMOPERTUBATION, FALLOPIAN TUBE;  Surgeon: Henry Slough, MD;  Location: [facility] OR;  Service: Gynecology;  Laterality: N/A;   DILATATION &  CURETTAGE/HYSTEROSCOPY WITH MYOSURE Right 10/02/2023   Procedure: HYSTEROSCOPY with DIALATION AND CURETTAGE;  Surgeon: Henry Slough, MD;  Location: [facility] OR;  Service: Gynecology;  Laterality: Right;   LAPAROSCOPIC ABDOMINAL EXPLORATION Bilateral 07/02/2013   @[facility]  by dr forbes. skinner;   LAPAROSCOPIC LEFT OVARIAN CYSTECTOMY CONVERSION EXPLORATORY LAPAROTOMY RESECTION RIGHT OVARIAN CYSTECTOMY   WISDOM TOOTH EXTRACTION  2011    OB History     Gravida  1   Para  1   Term      Preterm  1   AB      Living  1      SAB      IAB      Ectopic      Multiple  0   Live Births  1            Home Medications    Prior to Admission medications   Medication Sig Start Date End Date Taking? Authorizing Provider  ketoconazole  (NIZORAL ) 2 % cream Apply 1 Application topically 2 (two) times daily as needed for irritation. 03/10/24  Yes Stuart Vernell Norris, PA-C  ibuprofen  (ADVIL ) 600 MG tablet Take 1 tablet (600 mg total) by mouth every 6 (six) hours as needed for moderate pain (pain score 4-6), mild pain (pain score 1-3) or cramping. 10/02/23   Henry Slough, MD  Multiple Vitamin (  MULTIVITAMIN WITH MINERALS) TABS tablet Take 1 tablet by mouth at bedtime.    [provider]    Family History Family History  Problem Relation Age of Onset   Hyperlipidemia Mother    Hypertension Mother    Hyperlipidemia Father    Hypertension Father     Social History Social History   Tobacco Use   Smoking status: Never   Smokeless tobacco: Never  Vaping Use   Vaping status: Never Used  Substance Use Topics   Alcohol use: No   Drug use: Never     Allergies   Patient has no known allergies.   Review of Systems Review of Systems Per HPI  Physical Exam Triage Vital Signs ED Triage Vitals  Encounter Vitals Group     BP 03/10/24 1805 107/72     Girls Systolic BP Percentile --      Girls Diastolic BP Percentile --      Boys Systolic BP Percentile --      Boys Diastolic  BP Percentile --      Pulse Rate 03/10/24 1805 74     Resp 03/10/24 1805 18     Temp 03/10/24 1805 99.2 F (37.3 C)     Temp Source 03/10/24 1805 Oral     SpO2 03/10/24 1805 98 %     Weight --      Height --      Head Circumference --      Peak Flow --      Pain Score 03/10/24 1806 0     Pain Loc --      Pain Education --      Exclude from Growth Chart --    No data found.  Updated Vital Signs BP 107/72 (BP Location: Left Arm)   Pulse 74   Temp 99.2 F (37.3 C) (Oral)   Resp 18   LMP 02/11/2024 (Exact Date)   SpO2 98%   Visual Acuity Right Eye Distance:   Left Eye Distance:   Bilateral Distance:    Right Eye Near:   Left Eye Near:    Bilateral Near:     Physical Exam Vitals and nursing note reviewed.  Constitutional:      Appearance: Normal appearance. She is not ill-appearing.  HENT:     Head: Atraumatic.  Eyes:     Extraocular Movements: Extraocular movements intact.     Conjunctiva/sclera: Conjunctivae normal.  Cardiovascular:     Rate and Rhythm: Normal rate.  Pulmonary:     Effort: Pulmonary effort is normal.  Musculoskeletal:        General: Normal range of motion.     Cervical back: Normal range of motion and neck supple.  Skin:    General: Skin is warm and dry.     Comments: No appreciable scaling, discoloration or other abnormality to the scalp in the area of concern  Neurological:     Mental Status: She is alert and oriented to person, place, and time.  Psychiatric:        Mood and Affect: Mood normal.        Thought Content: Thought content normal.        Judgment: Judgment normal.      UC Treatments / Results  Labs (all labs ordered are listed, but only abnormal results are displayed) Labs Reviewed - No data to display  EKG   Radiology No results found.  Procedures Procedures (including critical care time)  Medications Ordered in UC Medications - No  data to display  Initial Impression / Assessment and Plan / UC Course  I  have reviewed the triage vital signs and the nursing notes.  Pertinent labs & imaging results that were available during my care of the patient were reviewed by me and considered in my medical decision making (see chart for details).     Currently no appreciable rash, however given her history of this and oncoming symptoms we will prescribe some ketoconazole  cream to be used as needed.  Discussed natural remedies additionally.  Return for worsening symptoms.  Final Clinical Impressions(s) / UC Diagnoses   Final diagnoses:  Tinea capitis   Discharge Instructions   None    ED Prescriptions     Medication Sig Dispense Auth. Provider   ketoconazole  (NIZORAL ) 2 % cream Apply 1 Application topically 2 (two) times daily as needed for irritation. 120 g Stuart Vernell Norris, [place]      PDMP not reviewed this encounter.   Stuart Vernell Norris, [place] 03/10/24 1949

## 2025-03-02 ENCOUNTER — Ambulatory Visit: Payer: PRIVATE HEALTH INSURANCE | Admitting: Family Medicine
